# Patient Record
Sex: Female | Born: 1955
Health system: Southern US, Community
[De-identification: ages and names within clinical notes are randomized; demographics above are authoritative.]

## PROBLEM LIST (undated history)

## (undated) DIAGNOSIS — Z780 Asymptomatic menopausal state: Secondary | ICD-10-CM

## (undated) DIAGNOSIS — G4700 Insomnia, unspecified: Secondary | ICD-10-CM

## (undated) DIAGNOSIS — I639 Cerebral infarction, unspecified: Secondary | ICD-10-CM

## (undated) DIAGNOSIS — J309 Allergic rhinitis, unspecified: Secondary | ICD-10-CM

## (undated) DIAGNOSIS — D225 Melanocytic nevi of trunk: Secondary | ICD-10-CM

## (undated) DIAGNOSIS — D229 Melanocytic nevi, unspecified: Secondary | ICD-10-CM

## (undated) HISTORY — DX: Asymptomatic menopausal state: Z78.0

## (undated) HISTORY — DX: Melanocytic nevi, unspecified: D22.9

## (undated) HISTORY — DX: Cerebral infarction, unspecified: I63.9

## (undated) HISTORY — DX: Allergic rhinitis, unspecified: J30.9

## (undated) HISTORY — DX: Melanocytic nevi of trunk: D22.5

## (undated) HISTORY — DX: Insomnia, unspecified: G47.00

---

## 1984-09-10 HISTORY — PX: APPENDECTOMY: SHX54

## 1984-09-10 HISTORY — PX: GALLBLADDER SURGERY: SHX652

## 1999-09-11 HISTORY — PX: PLACEMENT OF BREAST IMPLANTS: SHX6334

## 1999-11-27 ENCOUNTER — Other Ambulatory Visit: Admission: RE | Admit: 1999-11-27 | Discharge: 1999-11-27 | Payer: Self-pay | Admitting: Obstetrics and Gynecology

## 2001-01-10 ENCOUNTER — Other Ambulatory Visit: Admission: RE | Admit: 2001-01-10 | Discharge: 2001-01-10 | Payer: Self-pay | Admitting: Obstetrics and Gynecology

## 2002-01-26 ENCOUNTER — Other Ambulatory Visit: Admission: RE | Admit: 2002-01-26 | Discharge: 2002-01-26 | Payer: Self-pay | Admitting: Obstetrics and Gynecology

## 2003-02-12 ENCOUNTER — Other Ambulatory Visit: Admission: RE | Admit: 2003-02-12 | Discharge: 2003-02-12 | Payer: Self-pay | Admitting: Obstetrics and Gynecology

## 2004-02-16 ENCOUNTER — Other Ambulatory Visit: Admission: RE | Admit: 2004-02-16 | Discharge: 2004-02-16 | Payer: Self-pay | Admitting: Obstetrics and Gynecology

## 2005-03-29 ENCOUNTER — Other Ambulatory Visit: Admission: RE | Admit: 2005-03-29 | Discharge: 2005-03-29 | Payer: Self-pay | Admitting: Obstetrics and Gynecology

## 2005-11-30 ENCOUNTER — Inpatient Hospital Stay (HOSPITAL_COMMUNITY): Admission: AD | Admit: 2005-11-30 | Discharge: 2005-12-04 | Payer: Self-pay | Admitting: *Deleted

## 2006-02-05 ENCOUNTER — Encounter: Payer: Self-pay | Admitting: Interventional Radiology

## 2006-02-18 ENCOUNTER — Ambulatory Visit (HOSPITAL_COMMUNITY): Admission: RE | Admit: 2006-02-18 | Discharge: 2006-02-18 | Payer: Self-pay | Admitting: Interventional Radiology

## 2006-03-08 ENCOUNTER — Emergency Department (HOSPITAL_COMMUNITY): Admission: EM | Admit: 2006-03-08 | Discharge: 2006-03-09 | Payer: Self-pay | Admitting: Emergency Medicine

## 2006-05-27 ENCOUNTER — Ambulatory Visit: Payer: Self-pay | Admitting: Oncology

## 2006-07-23 ENCOUNTER — Encounter (HOSPITAL_COMMUNITY): Admission: RE | Admit: 2006-07-23 | Discharge: 2006-08-12 | Payer: Self-pay | Admitting: *Deleted

## 2008-08-11 ENCOUNTER — Ambulatory Visit (HOSPITAL_COMMUNITY): Admission: RE | Admit: 2008-08-11 | Discharge: 2008-08-11 | Payer: Self-pay | Admitting: Family Medicine

## 2008-08-11 ENCOUNTER — Encounter (INDEPENDENT_AMBULATORY_CARE_PROVIDER_SITE_OTHER): Payer: Self-pay | Admitting: Family Medicine

## 2008-08-11 ENCOUNTER — Ambulatory Visit: Payer: Self-pay | Admitting: Surgery

## 2010-09-30 ENCOUNTER — Encounter: Payer: Self-pay | Admitting: *Deleted

## 2011-01-26 NOTE — Discharge Summary (Signed)
NAMEDAREEN, Jennifer Ochoa            ACCOUNT NO.:  [id number]   MEDICAL RECORD NO.:  [id number]          PATIENT TYPE:  INP   LOCATION:  3019                         FACILITY:  [facility]   PHYSICIAN:  Pramod P. Pearlean Brownie, MD    DATE OF BIRTH:  09-22-1955   DATE OF ADMISSION:  11/30/2005  DATE OF DISCHARGE:  12/04/2005                                 DISCHARGE SUMMARY   DISCHARGE DIAGNOSES AT TIME OF DISCHARGE:  1.  Right internal carotid artery dissection while lifting weights.  2.  Migraines.   MEDICINES AT TIME OF DISCHARGE:  1.  Coumadin 3 mg daily.  2.  Antibiotics for rosacea.   STUDIES PERFORMED:  1.  MRI of the brain showed no acute infarct.  2.  MRA of the brain shows right carotid dissection.  3.  Carotid Doppler shows no ICA stenosis.  4.  Transcranial Doppler has been completed, results are pending.  5.  EKG shows normal sinus rhythm with left axis deviation.   LABORATORY DATA:  INR the day of discharge is 2.1.  CBC normal.  Chemistry  normal except alkaline phosphatase low at 33.  Hemoglobin A1c 5.3.  Homocysteine 10.5.  Cholesterol 129, HDL 59, LDL 61 and triglycerides 44.   HISTORY OF PRESENT ILLNESS:  Jennifer Ochoa is a 55 year old right-handed  white female who was seen by Dr. Nash Shearer, a neurologist, in the outpatient  clinic at the request of Dr. Haroldine Laws for evaluation in his initial  consultation for headaches and questionable right carotid dissection on MRA.  The patient stated about one week ago when she was lifting weights, she  developed central vision loss with severe pain behind her right eye, which  gradually improved.  The patient states that then every time she stood up  quickly or did a quick movement, she developed similar symptoms that lasted  about one minute in duration.  Her symptoms have again gradually improved.  She also states she hears a whishing in her ear ever since the onset of the  headache pain.  Triggers for this pain include standing up,  straining,  moving quickly or exerting herself.  She also states she has felt  lightheaded and dizzy at times.  Dr. Haroldine Laws performed an outpatient  MRI/MRA, which did reveal an acute right carotid dissection though no acute  stroke.  Dr. Nash Shearer admitted her to [facility] for treatment and  evaluation by the stroke service.   HOSPITAL COURSE:  The patient was started on IV heparin and Coumadin for  right ICA dissection.  She neurologically returned to normal with no further  symptoms in the hospital.  The patient was evaluated by PT and OT and felt  to have no needs.  She had no new vascular risk factors identified.  We are  suspicious for possible fibromuscular dysplasia given the clinical onset.  Plans are to keep her on Coumadin for several months, follow up with Dr.  Pearlean Brownie, do a cerebral angiogram at that time for possible FMD and then treat  as per his recommendation.  Will have her primary care physician follow up  Coumadin  levels.  The patient needs to follow a Coumadin-friendly diet.   CONDITION ON DISCHARGE:  Neurologically normal.   DISCHARGE PLAN:  1.  Discharge home in care of husband.  2.  Coumadin for secondary stroke prevention.  3.  Coumadin-associated diet.  The patient has met with a nutritionist but      may need further follow-up, as she has a very green diet.  Dr.   Bethann Humble ended at this point.      Jennifer Ochoa, N.P.    ______________________________  Jennifer Schlein. Pearlean Brownie, MD    SB/MEDQ  D:  12/04/2005  T:  12/05/2005  Job:  [id number]   cc:   Royetta Crochet, MD  Fax: [phone]

## 2011-01-26 NOTE — H&P (Signed)
[name], Ochoa            ACCOUNT NO.:  [id number]   MEDICAL RECORD NO.:  [id number]          PATIENT TYPE:  INP   LOCATION:  3019                         FACILITY:  [facility]   PHYSICIAN:  Bevelyn Buckles. Champey, M.D.DATE OF BIRTH:  12/03/1955   DATE OF ADMISSION:  11/30/2005  DATE OF DISCHARGE:                                HISTORY & PHYSICAL   REASON FOR ADMISSION:  Right carotid dissection and headaches.   HISTORY OF PRESENT ILLNESS:  Jennifer Ochoa is a 55 year old, right handed,  Caucasian female who is seen in outpatient clinic today at the request of  Dr. Haroldine Laws for evaluation/initial consultation of headaches and  questionable right carotid dissection on MRA.  The patient presents to the  clinic today stating one week ago, when she was lifting weights she  developed this central vision loss and severe pain behind her right eye and  gradually improved.  The patient states since then every time she stood up  quickly or did quick movement she developed similar symptoms that lasted  about one minute in duration.  Her symptoms have gradually improved.  She  also feels like she has a hearing/whooshing in her ear ever since the onset.  The pain behind the right eye is described as a pressure 4 in intensity  without any radiation.  Triggers to this include standing up, straining,  moving quickly or exerting herself.  The patient also feels slightly  lightheaded and dizzy at times.  The patient states she developed this a  couple of times a day, however, for the past few days has not felt the  sensation.  She does have a history of migraines and has had similar  episodes, however, the onset was gradual and it usually resolved.  These  symptoms were of acute onset and did not resolve.  The patient had some  palpitations as well during this time frame.  She denies any weakness,  numbness, speech changes, swallowing problems, chewing problems, vertigo,  balance problems, falls, or  loss of consciousness.   PAST MEDICAL HISTORY:  Infrequent migraines.   MEDICATIONS:  Decongestant.   FAMILY HISTORY:  Positive for stroke, heart disease, and migraines.   SOCIAL HISTORY:  The patient is married, currently lives with her husband  and daughter, drinks two caffeinated beverages per day.  Drinks two glasses  of wine weekly.  Denies any tobacco or drug use.  The patient works as a  Chiropractor.   REVIEW OF SYSTEMS:  Positive for headaches, loss of vision, dizziness,  decreased energy, fatigue, rapid heart beat.  Review of systems is negative  as per history of present illness and greater than seven other organ  systems.   PHYSICAL EXAMINATION:  VITAL SIGNS:  Blood pressure is 104/64, weight is 145  pounds, height is 5 feet 7 inches.  GENERAL:  This is a 55 year old Caucasian female in no acute distress,  comfortable, cooperative throughout the entire examination.  HEENT:  Normocephalic atraumatic.  Extraocular muscles are intact.  Pupils  are equal, round, and reactive to light.  NECK:  Supple.  No carotid bruits.  HEART:  Regular.  LUNGS:  Clear.  ABDOMEN:  Soft and nontender.  EXTREMITIES:  Show no edema with good pulses.  NEUROLOGIC:  The patient is awake, alert, and oriented x3.  Language is  fluent.  Memory and knowledge appear within normal limits.  Cranial nerves  II-XII appear grossly intact.  Motor examination shows 5/5 strength and  normal tone in all four extremities.  No drift is noted.  Sensory  examination is within normal limits to light touch.  Reflexes are 1 to 2+  throughout and symmetric.  Cerebellar function is within normal limits  finger-to-nose.  Gait is unremarkable.  The patient does not have a Romberg  sign present.   PROCEDURE:  The patient did have an MRI MRA of the brain which was reviewed.  Her MRI did not show any acute ischemia.  MRA showed very probable  dissection in the right cavernous portion of the carotid with possibly  part  of the right MCA occlusion and possibly small clot around that area.  These  films were reviewed with Dr. Pearlean Brownie, who agreed with the dissection in the  right carotid artery.   IMPRESSION:  Jennifer Ochoa is a 55 year old right handed Caucasian female  who is sent by Dr. Haroldine Laws today and seen outpatient setting for severe  headaches, visual disturbance, and by MRA presumed right carotid dissection.  I have reviewed the films and discussed them with Dr. Pearlean Brownie, our stroke  specialist, who agrees with the right carotid dissection.   1.  I will admit the patient to [facility] and started the patient      on IV heparin stroke protocol and transfer her to Coumadin with a goal      INR of 2 to 3.  2.  I have discussed with Dr. Pearlean Brownie the case, and the patient will need a      cerebral angiogram in three months' time to check the healing of the      dissection.  3.  We will admit her till her INR is therapeutic and follow up with her as      an outpatient with cerebral angiogram in a few months' time.  The      patient understands and agrees with the plan.      Bevelyn Buckles. Nash Shearer, M.D.  Electronically Signed     DRC/MEDQ  D:  11/30/2005  T:  12/01/2005  Job:  [id number]

## 2012-07-29 DIAGNOSIS — D225 Melanocytic nevi of trunk: Secondary | ICD-10-CM

## 2012-07-29 HISTORY — DX: Melanocytic nevi of trunk: D22.5

## 2013-03-31 DIAGNOSIS — D229 Melanocytic nevi, unspecified: Secondary | ICD-10-CM

## 2013-03-31 HISTORY — DX: Melanocytic nevi, unspecified: D22.9

## 2013-07-28 ENCOUNTER — Other Ambulatory Visit: Payer: Self-pay | Admitting: Obstetrics and Gynecology

## 2013-07-28 DIAGNOSIS — R928 Other abnormal and inconclusive findings on diagnostic imaging of breast: Secondary | ICD-10-CM

## 2013-08-12 ENCOUNTER — Institutional Professional Consult (permissible substitution): Payer: Self-pay | Admitting: Interventional Cardiology

## 2013-08-13 ENCOUNTER — Encounter (INDEPENDENT_AMBULATORY_CARE_PROVIDER_SITE_OTHER): Payer: Self-pay

## 2013-08-13 ENCOUNTER — Encounter: Payer: Self-pay | Admitting: Interventional Cardiology

## 2013-08-13 ENCOUNTER — Ambulatory Visit (INDEPENDENT_AMBULATORY_CARE_PROVIDER_SITE_OTHER): Payer: Managed Care, Other (non HMO) | Admitting: Interventional Cardiology

## 2013-08-13 ENCOUNTER — Ambulatory Visit
Admission: RE | Admit: 2013-08-13 | Discharge: 2013-08-13 | Disposition: A | Discharge status: Home / Self Care | Payer: Managed Care, Other (non HMO) | Source: Ambulatory Visit | Attending: Obstetrics and Gynecology | Admitting: Obstetrics and Gynecology

## 2013-08-13 ENCOUNTER — Encounter: Payer: Self-pay | Admitting: Cardiology

## 2013-08-13 VITALS — BP 110/70 | HR 64 | Ht 67.0 in | Wt 168.8 lb

## 2013-08-13 DIAGNOSIS — I7771 Dissection of carotid artery: Secondary | ICD-10-CM

## 2013-08-13 DIAGNOSIS — I2510 Atherosclerotic heart disease of native coronary artery without angina pectoris: Secondary | ICD-10-CM | POA: Insufficient documentation

## 2013-08-13 DIAGNOSIS — M542 Cervicalgia: Secondary | ICD-10-CM | POA: Insufficient documentation

## 2013-08-13 DIAGNOSIS — G459 Transient cerebral ischemic attack, unspecified: Secondary | ICD-10-CM | POA: Insufficient documentation

## 2013-08-13 DIAGNOSIS — R928 Other abnormal and inconclusive findings on diagnostic imaging of breast: Secondary | ICD-10-CM

## 2013-08-13 DIAGNOSIS — R079 Chest pain, unspecified: Secondary | ICD-10-CM

## 2013-08-13 NOTE — Patient Instructions (Signed)
Your physician has requested that you have an exercise tolerance test. For further information please visit [email or web address] Please also follow instruction sheet, as given.  Your physician has requested that you have a carotid duplex. This test is an ultrasound of the carotid arteries in your neck. It looks at blood flow through these arteries that supply the brain with blood. Allow one hour for this exam. There are no restrictions or special instructions.

## 2013-08-13 NOTE — Progress Notes (Signed)
Patient ID: Jennifer Ochoa, female   DOB: [birth year], 57 y.o.   MRN: [id number]     Patient ID: Jennifer Ochoa MRN: [id number] DOB/AGE: [birth year] 57 y.o.   Referring Physician Dr. Wynelle Link   Reason for Consultation chest pain  HPI: 57 y/o with a h/o carotid dissection.  She has been having chest pain at times.  It has been better recently since starting antiinflamatory meds.  It is worse with lying flat and with deep breathing.  She is very physically active. She walks several miles a day.  No problems when walking or doing martial arts.  No sequelae now from the carotid dissection.    She has not had any sweating, nausea, syncope, orthopnea, PND. Taking deep breaths causes pain when she has these episodes. They only occur while she is lying flat. Once she gets up, stretches and starts moving around, she feels quite well.   Current Outpatient Prescriptions  Medication Sig Dispense Refill  . ALPRAZolam (XANAX) 0.25 MG tablet Take 0.25 mg by mouth at bedtime as needed (half tablet as needed).       Marland Kitchen aspirin 81 MG tablet Take 81 mg by mouth daily.      Marland Kitchen BLACK COHOSH PO Take by mouth.      . meloxicam (MOBIC) 15 MG tablet Take 15 mg by mouth daily.       . minocycline (DYNACIN) 100 MG tablet Take 100 mg by mouth 2 (two) times daily.      Marland Kitchen zolpidem (AMBIEN) 10 MG tablet Take 10 mg by mouth at bedtime as needed (half tablet as needed).        No current facility-administered medications for this visit.   Past Medical History  Diagnosis Date  . Allergic rhinitis   . Insomnia   . Menopause   . Stroke     Family History  Problem Relation Age of Onset  . Heart attack Mother   . CVA Mother   . Lung cancer Father   . Prostate cancer Father     History   Social History  . Marital Status: Married    Spouse Name: N/A    Number of Children: N/A  . Years of Education: N/A   Occupational History  . Not on file.   Social History Main Topics  . Smoking status: Never Smoker   .  Smokeless tobacco: Not on file  . Alcohol Use: Not on file  . Drug Use: Not on file  . Sexual Activity: Not on file   Other Topics Concern  . Not on file   Social History Narrative  . No narrative on file    Past Surgical History  Procedure Laterality Date  . Gallbladder surgery  1986  . Appendectomy  1986  . Placement of breast implants  2001      (Not in a hospital admission)  Review of systems complete and found to be negative unless listed above .  No nausea, vomiting.  No fever chills, No focal weakness,  No palpitations.  Physical Exam: Filed Vitals:   08/13/13 1031  BP: 110/70  Pulse: 64    Weight: 168 lb 12.8 oz (76.567 kg)  Physical exam:  [place]/AT EOMI No JVD, No carotid bruit RRR S1S2  No wheezing Soft. NT, nondistended No edema. No focal motor or sensory deficits Normal affect  Labs:   No results found for this basename: WBC, HGB, HCT, MCV, PLT   No results found for this basename: NA, K, CL, CO2,  BUN, CREATININE, CALCIUM, LABALBU, PROT, BILITOT, ALKPHOS, ALT, AST, GLUCOSE,  in the last 168 hours No results found for this basename: CKTOTAL, CKMB, CKMBINDEX, TROPONINI    No results found for this basename: CHOL   No results found for this basename: HDL   No results found for this basename: LDLCALC   No results found for this basename: TRIG   No results found for this basename: CHOLHDL   No results found for this basename: LDLDIRECT      Radiology: 2011 carotid duplex showed no significant plaque bilaterally, despite history of carotid dissection EKG: normal sinus rhythm, nonspecific ST-T wave changes  ASSESSMENT AND PLAN:   Chest pain:: several atypical features.I doubt this is ischemic pain. Will plan for exercise treadmill test just to get her an exercise prescription. Her lipids have been well controlled. Her LDL was in the 70s at last check a few weeks ago.  Carotid artery dissection: She states that she had some small strokes at the  immediate time of a carotid dissection, but does neurologic deficits have resolved. I would recommend avoiding any heavy weight lifting or straining given her history of dissection. Signed:   Fredric Mare, MD, [facility] 08/13/2013, 10:49 AM

## 2013-08-28 ENCOUNTER — Telehealth: Payer: Self-pay | Admitting: Interventional Cardiology

## 2013-08-28 NOTE — Telephone Encounter (Signed)
New problem       Pt called and would like to know if she can wear a monitor since the pressure, crushing feeling on the lift side of her chest, happens at night when she is laying down.  Before she has the Stress tests.   Please leave a message on cell if you do not get her.

## 2013-09-23 ENCOUNTER — Ambulatory Visit (INDEPENDENT_AMBULATORY_CARE_PROVIDER_SITE_OTHER): Payer: Managed Care, Other (non HMO) | Admitting: Physician Assistant

## 2013-09-23 ENCOUNTER — Ambulatory Visit (HOSPITAL_COMMUNITY): Payer: Managed Care, Other (non HMO) | Attending: Cardiology

## 2013-09-23 ENCOUNTER — Encounter: Payer: Self-pay | Admitting: Cardiology

## 2013-09-23 DIAGNOSIS — R079 Chest pain, unspecified: Secondary | ICD-10-CM

## 2013-09-23 DIAGNOSIS — I7771 Dissection of carotid artery: Secondary | ICD-10-CM

## 2013-09-23 DIAGNOSIS — I2510 Atherosclerotic heart disease of native coronary artery without angina pectoris: Secondary | ICD-10-CM | POA: Insufficient documentation

## 2013-09-23 DIAGNOSIS — Z09 Encounter for follow-up examination after completed treatment for conditions other than malignant neoplasm: Secondary | ICD-10-CM | POA: Insufficient documentation

## 2013-09-23 DIAGNOSIS — Z8673 Personal history of transient ischemic attack (TIA), and cerebral infarction without residual deficits: Secondary | ICD-10-CM | POA: Insufficient documentation

## 2013-09-23 NOTE — Progress Notes (Signed)
Exercise Treadmill Test  Pre-Exercise Testing Evaluation Rhythm: sinus bradycardia  Rate: 57 bpm     Test  Exercise Tolerance Test Ordering MD: Casandra Doffing, MD  Interpreting MD: Truitt Merle, NP  Unique Test No: 1  Treadmill:  1  Indication for ETT: chest pain - rule out ischemia  Contraindication to ETT: No   Stress Modality: exercise - treadmill  Cardiac Imaging Performed: non   Protocol: standard Bruce - maximal  Max BP:  196/83  Max MPHR (bpm):  162 85% MPR (bpm):  138  MPHR obtained (bpm):  160 % MPHR obtained:  98  Reached 85% MPHR (min:sec):  8:00 Total Exercise Time (min-sec):  10:00  Workload in METS:  11.7 Borg Scale: 17  Reason ETT Terminated:  patient's desire to stop    ST Segment Analysis At Rest: normal ST segments - no evidence of significant ST depression With Exercise: non-specific ST changes  Other Information Arrhythmia:  No Angina during ETT:  absent (0) Quality of ETT:  diagnostic  ETT Interpretation:  normal - no evidence of ischemia by ST analysis  Comments: Excellent exercise capacity. No chest pain. Normal BP response to exercise. There was increased artifact that interferes with interpretation somewhat. There was insignificant up-sloping ST depression at peak exercise.  No significant ST changes to suggest ischemia.   Recommendations: F/u with Dr. Casandra Doffing as directed. Signed,  Richardson Dopp, PA-C   09/23/2013 11:36 AM

## 2013-09-24 ENCOUNTER — Encounter (HOSPITAL_COMMUNITY): Payer: Self-pay | Admitting: Interventional Cardiology

## 2014-08-23 ENCOUNTER — Ambulatory Visit (INDEPENDENT_AMBULATORY_CARE_PROVIDER_SITE_OTHER): Payer: Managed Care, Other (non HMO) | Admitting: Podiatry

## 2014-08-23 ENCOUNTER — Ambulatory Visit (INDEPENDENT_AMBULATORY_CARE_PROVIDER_SITE_OTHER): Payer: Managed Care, Other (non HMO)

## 2014-08-23 ENCOUNTER — Encounter: Payer: Self-pay | Admitting: Podiatry

## 2014-08-23 VITALS — BP 124/70 | HR 70 | Resp 18

## 2014-08-23 DIAGNOSIS — R52 Pain, unspecified: Secondary | ICD-10-CM

## 2014-08-23 DIAGNOSIS — M2042 Other hammer toe(s) (acquired), left foot: Secondary | ICD-10-CM

## 2014-08-23 DIAGNOSIS — M2012 Hallux valgus (acquired), left foot: Secondary | ICD-10-CM

## 2014-08-23 DIAGNOSIS — M21612 Bunion of left foot: Secondary | ICD-10-CM

## 2014-08-23 NOTE — Progress Notes (Signed)
° °  Subjective:    Patient ID: Jennifer Ochoa, female    DOB: 1956/03/13, 58 y.o.   MRN: [id number]  HPI MY 2ND TOE ON THE LEFT IS GRADUALLY GETTING WORSE AND HAS BEEN HURTING FOR ABOUT 2 MONTHS AND HAS PAIN ON THE BALL OF MY LEFT FOOT AND HURTS ALL THE TIME    Review of Systems  All other systems reviewed and are negative.      Objective:   Physical Exam        Assessment & Plan:

## 2014-08-25 NOTE — Progress Notes (Signed)
Subjective:     Patient ID: Jennifer Ochoa, female   DOB: 1955/09/12, 58 y.o.   MRN: [id number]  HPI patient states that she's getting more problems with her left foot and the second toe has moved quite a bit and a upwards direction towards her big toe and it's becoming painful in the joint and also her structural bunion seems to be worsening. We had corrected her right bunion years ago   Review of Systems  All other systems reviewed and are negative.      Objective:   Physical Exam  Constitutional: She is oriented to person, place, and time.  Cardiovascular: Intact distal pulses.   Musculoskeletal: Normal range of motion.  Neurological: She is oriented to person, place, and time.  Skin: Skin is warm.  Nursing note and vitals reviewed.  neurovascular status intact with muscle strength adequate range of motion within normal limits and negative Homans sign noted. Patient is noted to have good digital perfusion is well oriented 3 with no equinus condition noted and is found to have structural bunion deformity left with the left hallux deviated against the second toe and elevation and medial rotation of the second toe left with inflammation of the second metatarsophalangeal joint upon palpation     Assessment:     Structural imbalance of the left foot with probable flexor plate stretch or tear of the second MPJ and gradual worsening of structural bony conditions with inflammatory capsulitis    Plan:     H&P and x-rays reviewed with patient. At this point I have recommended due to the extreme movement of the second toe pain that she is experiencing the consideration for structural correction versus injection treatment which would be temporary. Patient has opted for correction of foot and since she had the other one done she would like to read a consent form now so she does not have another co-pay. At this time I allowed her to read consent form for correction reviewing [place] bunionectomy  left with pin fixation digital fusion left and shortening transpositional osteotomy second metatarsal left. Explain the second toe would be stiff afterwards and may lift in the air and that all possible complications as listed in the consent form is possible. She understands this and after extensive review signs consent form understanding total recovery. Can take upwards of 6 months scheduled for outpatient surgery and is given all preoperative instructions

## 2014-09-07 ENCOUNTER — Encounter: Payer: Self-pay | Admitting: Podiatry

## 2014-09-07 DIAGNOSIS — M21542 Acquired clubfoot, left foot: Secondary | ICD-10-CM

## 2014-09-07 DIAGNOSIS — M2012 Hallux valgus (acquired), left foot: Secondary | ICD-10-CM

## 2014-09-07 DIAGNOSIS — M2042 Other hammer toe(s) (acquired), left foot: Secondary | ICD-10-CM

## 2014-09-08 ENCOUNTER — Other Ambulatory Visit: Payer: Self-pay | Admitting: Podiatry

## 2014-09-08 MED ORDER — ONDANSETRON 8 MG PO TBDP: 8.0000 mg | ORAL_TABLET | Freq: Three times a day (TID) | ORAL | Status: DC | PRN

## 2014-09-13 ENCOUNTER — Telehealth: Payer: Self-pay | Admitting: *Deleted

## 2014-09-13 NOTE — Telephone Encounter (Signed)
Pt left only name and phone number.

## 2014-09-14 ENCOUNTER — Encounter: Payer: Self-pay | Admitting: Podiatry

## 2014-09-14 ENCOUNTER — Ambulatory Visit (INDEPENDENT_AMBULATORY_CARE_PROVIDER_SITE_OTHER): Payer: Managed Care, Other (non HMO) | Admitting: Podiatry

## 2014-09-14 ENCOUNTER — Ambulatory Visit (INDEPENDENT_AMBULATORY_CARE_PROVIDER_SITE_OTHER): Payer: Managed Care, Other (non HMO)

## 2014-09-14 VITALS — BP 113/67 | HR 79 | Resp 16

## 2014-09-14 DIAGNOSIS — M2012 Hallux valgus (acquired), left foot: Secondary | ICD-10-CM

## 2014-09-14 DIAGNOSIS — M21612 Bunion of left foot: Secondary | ICD-10-CM

## 2014-09-14 DIAGNOSIS — M2042 Other hammer toe(s) (acquired), left foot: Secondary | ICD-10-CM

## 2014-09-14 NOTE — Progress Notes (Signed)
Subjective:     Patient ID: Jennifer Ochoa, female   DOB: 05-Jun-1956, 59 y.o.   MRN: [id number]  HPI patient states I'm doing really well with my left with minimal discomfort or swelling   Review of Systems     Objective:   Physical Exam Neurovascular status intact muscle strength adequate with wound edges found to be well coapted and pin in place second toe left with good structural bunion correction noted    Assessment:     Doing well post forefoot reconstruction left    Plan:     H&P and x-rays reviewed. Reapplied sterile dressing instructed on continued elevation compression and immobilization and reappoint to recheck again in 4 weeks for consideration of pin removal. Patient will be seen back earlier if any issues should occur

## 2014-09-15 NOTE — Progress Notes (Signed)
Dr Paulla Dolly performed a left [facility] bunion repair, left 2nd met osteotomy and left 2nd met hammertoe repair on 12.29.15

## 2014-10-12 ENCOUNTER — Other Ambulatory Visit: Payer: Self-pay | Admitting: Podiatry

## 2014-10-12 ENCOUNTER — Ambulatory Visit (INDEPENDENT_AMBULATORY_CARE_PROVIDER_SITE_OTHER): Payer: Managed Care, Other (non HMO) | Admitting: Podiatry

## 2014-10-12 ENCOUNTER — Ambulatory Visit (INDEPENDENT_AMBULATORY_CARE_PROVIDER_SITE_OTHER): Payer: Managed Care, Other (non HMO)

## 2014-10-12 ENCOUNTER — Ambulatory Visit: Payer: Managed Care, Other (non HMO)

## 2014-10-12 ENCOUNTER — Encounter: Payer: Self-pay | Admitting: Podiatry

## 2014-10-12 VITALS — BP 118/67 | HR 74 | Resp 16

## 2014-10-12 DIAGNOSIS — M2012 Hallux valgus (acquired), left foot: Secondary | ICD-10-CM

## 2014-10-12 DIAGNOSIS — M2042 Other hammer toe(s) (acquired), left foot: Secondary | ICD-10-CM

## 2014-10-12 DIAGNOSIS — M21612 Bunion of left foot: Secondary | ICD-10-CM

## 2014-10-12 NOTE — Progress Notes (Signed)
Subjective:     Patient ID: Jennifer Ochoa, female   DOB: 1956/03/01, 59 y.o.   MRN: [id number]  HPI patient states I'm doing very well with my left foot and I'm able to walk without pain   Review of Systems     Objective:   Physical Exam Neurovascular status intact pin in place second toe left with wound edges well coapted and good alignment noted    Assessment:     Doing well post forefoot reconstruction left    Plan:     Pin removed second toe with dressing applied and x-ray taken showing excellent results. Begin gradual increase in activity levels

## 2014-11-04 ENCOUNTER — Telehealth: Payer: Self-pay | Admitting: *Deleted

## 2014-11-04 ENCOUNTER — Ambulatory Visit (INDEPENDENT_AMBULATORY_CARE_PROVIDER_SITE_OTHER): Payer: Managed Care, Other (non HMO) | Admitting: Podiatry

## 2014-11-04 ENCOUNTER — Encounter: Payer: Self-pay | Admitting: Podiatry

## 2014-11-04 VITALS — BP 116/76 | HR 76 | Resp 12

## 2014-11-04 DIAGNOSIS — M2012 Hallux valgus (acquired), left foot: Secondary | ICD-10-CM

## 2014-11-04 DIAGNOSIS — M21612 Bunion of left foot: Secondary | ICD-10-CM

## 2014-11-04 DIAGNOSIS — R52 Pain, unspecified: Secondary | ICD-10-CM

## 2014-11-04 NOTE — Telephone Encounter (Signed)
DOS 12/292015  Pt states she is having pain behind the 2nd toe of her surgery foot with a little swelling and BB feeliing on the bottom.  I encouraged pt to use ice for the swelling and discomfort, and transferred to the schedulers for earlier appt than 03/0/03/2015.

## 2014-11-05 NOTE — Progress Notes (Signed)
Subjective:     Patient ID: Jennifer Ochoa, female   DOB: 05/03/1956, 59 y.o.   MRN: [id number]  HPI patient states she feels like there is a little balls in the bottom of her second toe left foot   Review of Systems     Objective:   Physical Exam Neurovascular status intact with irritation underneath the second toe left that's nondescript in nature with no specific injury noted    Assessment:     May be excessive edema during healing process    Plan:     Advised on soaks and we may need to do a small injection if symptoms persist

## 2014-11-15 ENCOUNTER — Ambulatory Visit (INDEPENDENT_AMBULATORY_CARE_PROVIDER_SITE_OTHER): Payer: Managed Care, Other (non HMO) | Admitting: Podiatry

## 2014-11-15 ENCOUNTER — Encounter: Payer: Self-pay | Admitting: Podiatry

## 2014-11-15 ENCOUNTER — Ambulatory Visit: Payer: Self-pay

## 2014-11-15 VITALS — BP 109/77 | HR 68 | Resp 12

## 2014-11-15 DIAGNOSIS — Z9889 Other specified postprocedural states: Secondary | ICD-10-CM

## 2014-11-16 NOTE — Progress Notes (Signed)
Subjective:     Patient ID: Jennifer Ochoa, female   DOB: 05-Jun-1956, 59 y.o.   MRN: [id number]  HPI patient states my left foot is feeling quite a bit better the second toe is doing better   Review of Systems     Objective:   Physical Exam Neurovascular status intact with muscle strength adequate and patient's noted to have some inflammation in the plantar aspect left second toe that is sore when pressed much improved over previous    Assessment:     Doing better after extensive forefoot reconstruction left    Plan:     Advised on continued soaks and he treatments the bottom of the toe and continued range of motion exercises. Return to shoe gear and she is given return to work on Monday as she is making continued good improvement

## 2017-01-07 ENCOUNTER — Other Ambulatory Visit: Payer: Self-pay | Admitting: Endocrinology

## 2017-01-07 DIAGNOSIS — E010 Iodine-deficiency related diffuse (endemic) goiter: Secondary | ICD-10-CM

## 2017-01-15 ENCOUNTER — Other Ambulatory Visit: Payer: Managed Care, Other (non HMO)

## 2017-01-18 ENCOUNTER — Ambulatory Visit
Admission: RE | Admit: 2017-01-18 | Discharge: 2017-01-18 | Disposition: A | Discharge status: Home / Self Care | Payer: Managed Care, Other (non HMO) | Source: Ambulatory Visit | Attending: Endocrinology | Admitting: Endocrinology

## 2017-01-18 DIAGNOSIS — E010 Iodine-deficiency related diffuse (endemic) goiter: Secondary | ICD-10-CM

## 2017-12-06 ENCOUNTER — Other Ambulatory Visit: Payer: Self-pay | Admitting: Obstetrics and Gynecology

## 2017-12-06 DIAGNOSIS — Z803 Family history of malignant neoplasm of breast: Secondary | ICD-10-CM

## 2018-10-22 ENCOUNTER — Ambulatory Visit: Payer: Managed Care, Other (non HMO) | Admitting: Podiatry

## 2018-10-22 ENCOUNTER — Ambulatory Visit (INDEPENDENT_AMBULATORY_CARE_PROVIDER_SITE_OTHER): Payer: 59

## 2018-10-22 ENCOUNTER — Encounter: Payer: Self-pay | Admitting: Podiatry

## 2018-10-22 DIAGNOSIS — M2042 Other hammer toe(s) (acquired), left foot: Secondary | ICD-10-CM | POA: Diagnosis not present

## 2018-10-22 DIAGNOSIS — G5762 Lesion of plantar nerve, left lower limb: Secondary | ICD-10-CM

## 2018-10-22 DIAGNOSIS — D3610 Benign neoplasm of peripheral nerves and autonomic nervous system, unspecified: Secondary | ICD-10-CM

## 2018-10-26 NOTE — Progress Notes (Signed)
Subjective:   Patient ID: Jennifer Ochoa, female   DOB: 63 y.o.   MRN: [id number]   HPI Patient presents stating I seem to be getting a lot of pain in my second toe and it seems to come and go and I am not sure what causes it.  I did have hammertoe and bunion surgery performed a number of years ago   Review of Systems  All other systems reviewed and are negative.       Objective:  Physical Exam Vitals signs and nursing note reviewed.  Constitutional:      Appearance: She is well-developed.  Pulmonary:     Effort: Pulmonary effort is normal.  Musculoskeletal: Normal range of motion.  Skin:    General: Skin is warm.  Neurological:     Mental Status: She is alert.     Neurovascular status intact muscle strength is adequate range of motion within normal limits with patient noted to have inflammation pain which appears to be mostly in the second intermetatarsal space with positive Mulder sign and radiating discomfort into the adjacent digit.  Patient second digit overall is in good alignment and the interphalangeal joint appears to be healed well     Assessment:  Probability for neuroma symptomatology second interspace left versus other pathology     Plan:  H&P condition reviewed and today I met a focus on the interspace.  I did sterile prep of the area I then injected directly into the interspace with a combination of steroidal medication and Marcaine and we will see results over the next few weeks and decide whether or not to continue treatment is necessary or possible surgical resection.  Reappoint to reevaluate  X-ray indicates fixation is in place good alignment second digit with no elevation or pathology of the MPJ

## 2019-04-10 ENCOUNTER — Other Ambulatory Visit: Payer: Self-pay

## 2019-04-10 DIAGNOSIS — Z20822 Contact with and (suspected) exposure to covid-19: Secondary | ICD-10-CM

## 2019-04-11 LAB — NOVEL CORONAVIRUS, NAA: SARS-CoV-2, NAA: NOT DETECTED

## 2019-05-06 ENCOUNTER — Other Ambulatory Visit: Payer: Self-pay | Admitting: Family Medicine

## 2019-05-06 DIAGNOSIS — Z1231 Encounter for screening mammogram for malignant neoplasm of breast: Secondary | ICD-10-CM

## 2019-06-02 ENCOUNTER — Other Ambulatory Visit: Payer: Self-pay | Admitting: Family Medicine

## 2019-06-02 ENCOUNTER — Other Ambulatory Visit (HOSPITAL_COMMUNITY)
Admission: RE | Admit: 2019-06-02 | Discharge: 2019-06-02 | Disposition: A | Discharge status: Home / Self Care | Payer: 59 | Source: Ambulatory Visit | Attending: Family Medicine | Admitting: Family Medicine

## 2019-06-02 DIAGNOSIS — Z01419 Encounter for gynecological examination (general) (routine) without abnormal findings: Secondary | ICD-10-CM | POA: Diagnosis not present

## 2019-06-10 LAB — CYTOLOGY - PAP
Adequacy: ABSENT
Diagnosis: NEGATIVE

## 2019-06-18 ENCOUNTER — Other Ambulatory Visit: Payer: Self-pay

## 2019-06-18 ENCOUNTER — Ambulatory Visit
Admission: RE | Admit: 2019-06-18 | Discharge: 2019-06-18 | Disposition: A | Discharge status: Home / Self Care | Payer: Managed Care, Other (non HMO) | Source: Ambulatory Visit | Attending: Family Medicine | Admitting: Family Medicine

## 2019-06-18 DIAGNOSIS — Z1231 Encounter for screening mammogram for malignant neoplasm of breast: Secondary | ICD-10-CM

## 2019-12-25 ENCOUNTER — Other Ambulatory Visit: Payer: Self-pay

## 2019-12-25 ENCOUNTER — Ambulatory Visit: Payer: 59 | Admitting: Physician Assistant

## 2019-12-25 ENCOUNTER — Encounter: Payer: Self-pay | Admitting: Physician Assistant

## 2019-12-25 DIAGNOSIS — Z1283 Encounter for screening for malignant neoplasm of skin: Secondary | ICD-10-CM | POA: Diagnosis not present

## 2019-12-25 NOTE — Progress Notes (Signed)
   Follow-Up Visit   Subjective  Jennifer Ochoa is a 64 y.o. female who presents for the following: Annual Exam (no concerns). H/O DN.    The following portions of the chart were reviewed this encounter and updated as appropriate: Tobacco  Allergies  Meds  Problems  Med Hx  Surg Hx  Fam Hx      Objective  Well appearing patient in no apparent distress; mood and affect are within normal limits.  A full examination was performed including scalp, head, eyes, ears, nose, lips, neck, chest, axillae, abdomen, back, buttocks, bilateral upper extremities, bilateral lower extremities, hands, feet, fingers, toes, fingernails, and toenails. All findings within normal limits unless otherwise noted below.  Objective  Head to Toe: No DN, No signs NMSC   Assessment & Plan  Screening exam for skin cancer Head to Toe  Yearly skin examinations

## 2020-03-11 ENCOUNTER — Encounter: Payer: Self-pay | Admitting: Podiatry

## 2020-03-11 ENCOUNTER — Ambulatory Visit (INDEPENDENT_AMBULATORY_CARE_PROVIDER_SITE_OTHER): Payer: 59 | Admitting: Podiatry

## 2020-03-11 ENCOUNTER — Other Ambulatory Visit: Payer: Self-pay

## 2020-03-11 ENCOUNTER — Ambulatory Visit: Payer: 59

## 2020-03-11 DIAGNOSIS — D3610 Benign neoplasm of peripheral nerves and autonomic nervous system, unspecified: Secondary | ICD-10-CM | POA: Diagnosis not present

## 2020-03-11 DIAGNOSIS — Z472 Encounter for removal of internal fixation device: Secondary | ICD-10-CM | POA: Diagnosis not present

## 2020-03-11 DIAGNOSIS — M2042 Other hammer toe(s) (acquired), left foot: Secondary | ICD-10-CM

## 2020-03-16 NOTE — Progress Notes (Signed)
Subjective:   Patient ID: Jennifer Ochoa, female   DOB: 64 y.o.   MRN: [id number]   HPI Patient presents stating I am getting a lot of pain between my second and third digits on my left foot and it improved after the injection but only for a short period of time I tried wider shoes I tried other treatments and I am looking for something more permanent   ROS      Objective:  Physical Exam  Neurovascular status intact with patient found to have shooting pain second interspace left foot with radiating discomfort between the 2 toes positive Mulder sign.  Patient also may have some issues with previous screw that is been inserted second metatarsal left with mild discomfort occurring dorsal and plantar around the screws itself.     Assessment:  Probability for neuroma symptomatology second interspace left foot along with probability for abnormal screw position second metatarsal     Plan:  H NP reviewed condition and discussed treatment options.  She would like a more definitive treatment and I recommended combination of second interspace neurectomy along with screw removal and I explained procedure risk and allowed her to review been then signed consent form going over alternative treatments complications.  Patient understands there is no guarantee as far success and understands everything is outlined and is willing to accept risk and is scheduled for outpatient surgery.  Patient encouraged to call with questions concerns  X-rays were negative for signs of arthritis or stress fracture associated with the pain she is experiencing

## 2020-03-17 ENCOUNTER — Telehealth: Payer: Self-pay

## 2020-03-17 NOTE — Telephone Encounter (Signed)
DOS 03/29/2020  REMOVAL FIXATION DEEP LT - 20680 NEURECTOMY 2ND LT - 28080  [employer] EFFECTIVE DATE - 09/10/2017  PLAN DEDUCTIBLE - $500.00 W/ $500.00 REMAINING OUT OF POCKET - $2000.00 W/ $1950.76 REMAINING COPAY $0.00 COINSURANCE - 80%  [facility] CALL REF # [id number]  SPOKE TO ERIN D AT [facility] STATED CPT 20680 AND 01655 DO NOT REQUIRE AUTH. CALL REF # Junie Panning D 03/17/2020

## 2020-03-28 MED ORDER — HYDROCODONE-ACETAMINOPHEN 10-325 MG PO TABS: 1.0000 | ORAL_TABLET | Freq: Three times a day (TID) | ORAL | 0 refills | Status: AC | PRN

## 2020-03-28 NOTE — Addendum Note (Signed)
Addended by: Wallene Huh on: 03/28/2020 01:31 PM   Modules accepted: Orders

## 2020-03-29 ENCOUNTER — Encounter: Payer: Self-pay | Admitting: Podiatry

## 2020-03-29 DIAGNOSIS — G5761 Lesion of plantar nerve, right lower limb: Secondary | ICD-10-CM | POA: Diagnosis not present

## 2020-04-06 ENCOUNTER — Encounter: Payer: Self-pay | Admitting: Podiatry

## 2020-04-06 ENCOUNTER — Ambulatory Visit (INDEPENDENT_AMBULATORY_CARE_PROVIDER_SITE_OTHER): Payer: 59 | Admitting: Podiatry

## 2020-04-06 ENCOUNTER — Other Ambulatory Visit: Payer: Self-pay

## 2020-04-06 DIAGNOSIS — D3610 Benign neoplasm of peripheral nerves and autonomic nervous system, unspecified: Secondary | ICD-10-CM

## 2020-04-18 MED ORDER — DOXYCYCLINE HYCLATE 100 MG PO TABS: 100.0000 mg | ORAL_TABLET | Freq: Two times a day (BID) | ORAL | 0 refills | Status: AC

## 2020-05-13 ENCOUNTER — Ambulatory Visit (INDEPENDENT_AMBULATORY_CARE_PROVIDER_SITE_OTHER): Payer: Medicare HMO | Admitting: Podiatry

## 2020-05-13 ENCOUNTER — Other Ambulatory Visit: Payer: Self-pay

## 2020-05-13 ENCOUNTER — Encounter: Payer: Self-pay | Admitting: Podiatry

## 2020-05-13 DIAGNOSIS — M779 Enthesopathy, unspecified: Secondary | ICD-10-CM

## 2020-05-17 DIAGNOSIS — Z8679 Personal history of other diseases of the circulatory system: Secondary | ICD-10-CM | POA: Diagnosis not present

## 2020-05-17 DIAGNOSIS — Z136 Encounter for screening for cardiovascular disorders: Secondary | ICD-10-CM | POA: Diagnosis not present

## 2020-05-17 DIAGNOSIS — Z0000 Encounter for general adult medical examination without abnormal findings: Secondary | ICD-10-CM | POA: Diagnosis not present

## 2020-05-17 DIAGNOSIS — Z131 Encounter for screening for diabetes mellitus: Secondary | ICD-10-CM | POA: Diagnosis not present

## 2020-05-17 NOTE — Progress Notes (Signed)
Subjective:   Patient ID: Jennifer Ochoa, female   DOB: 64 y.o.   MRN: [id number]   HPI Patient presents stating she is still getting some pain in her left foot and while it does not feel quite the same she wanted to get it checked neuroma   ROS      Objective:  Physical Exam  Neurovascular status intact with patient noted to have significant diminishment of the sharp pain she had previously but there is some pain that is more around the second metatarsal phalangeal joint     Assessment:  Possibility for low-grade capsulitis secondary increase blood flow into the area with discomfort in the second interspace which has improved     Plan:  H&P reviewed condition.  At this point I did apply padding system to reduce stress on the joint advised on anti-inflammatory stiff bottom shoes and will be seen back for [place] to recheck as needed.  May require cortisone injection or other treatment

## 2020-07-08 IMAGING — MG MM  DIGITAL SCREENING BREAST BILAT IMPLANT W/ TOMO W/ CAD
8 of 12 series · 8 of 28 positions shown · non-contrast
Comparison: Previous exam(s).

CLINICAL DATA: Screening.

EXAM:
DIGITAL SCREENING BILATERAL MAMMOGRAM WITH IMPLANTS, CAD AND TOMO
The patient has retropectoral implants. Standard and implant
displaced views were performed.

[L MLO]
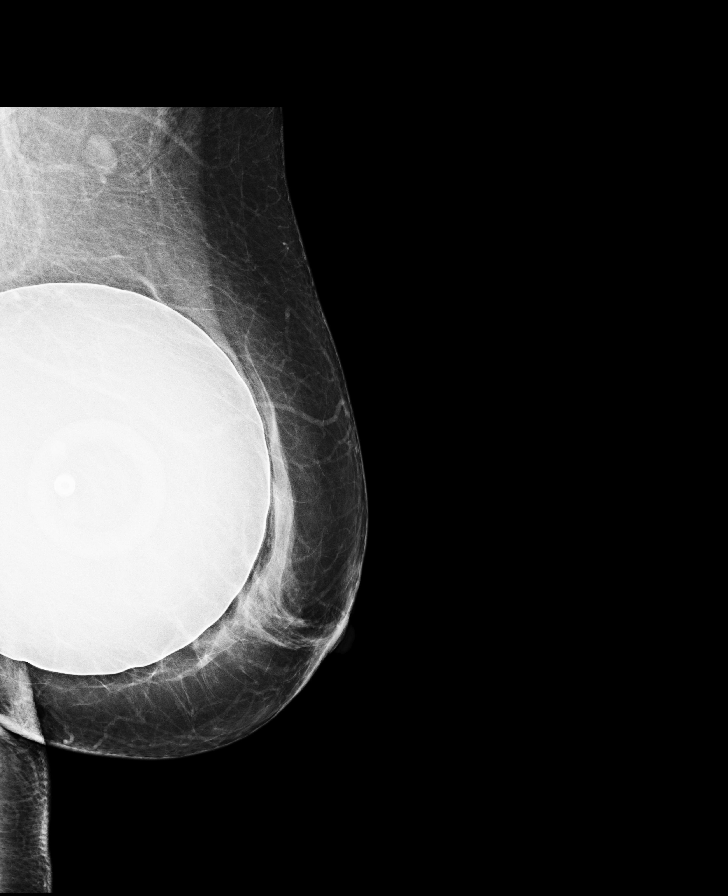

[R CC]
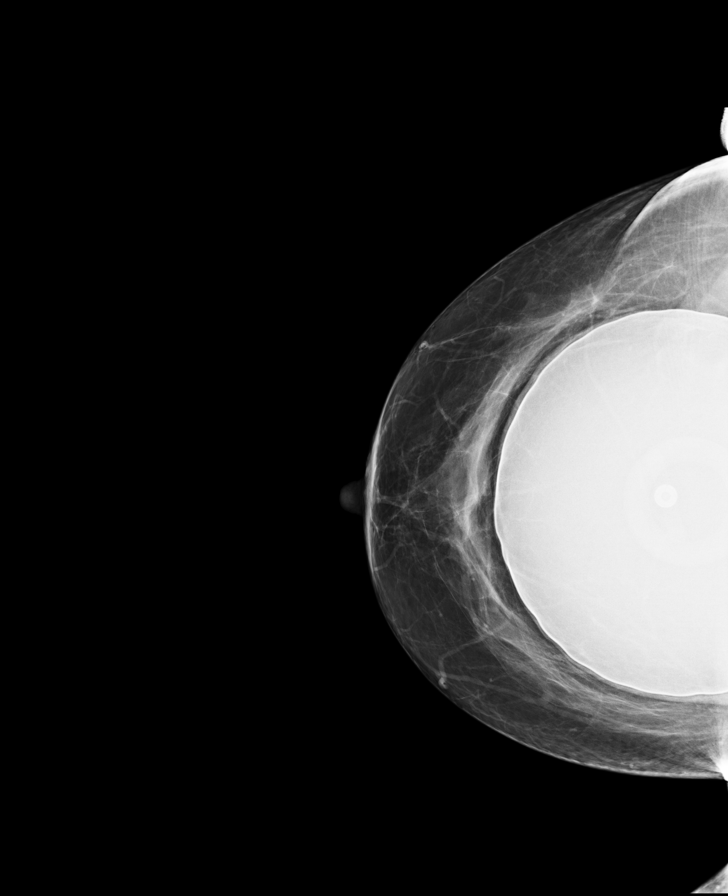

[L CC]
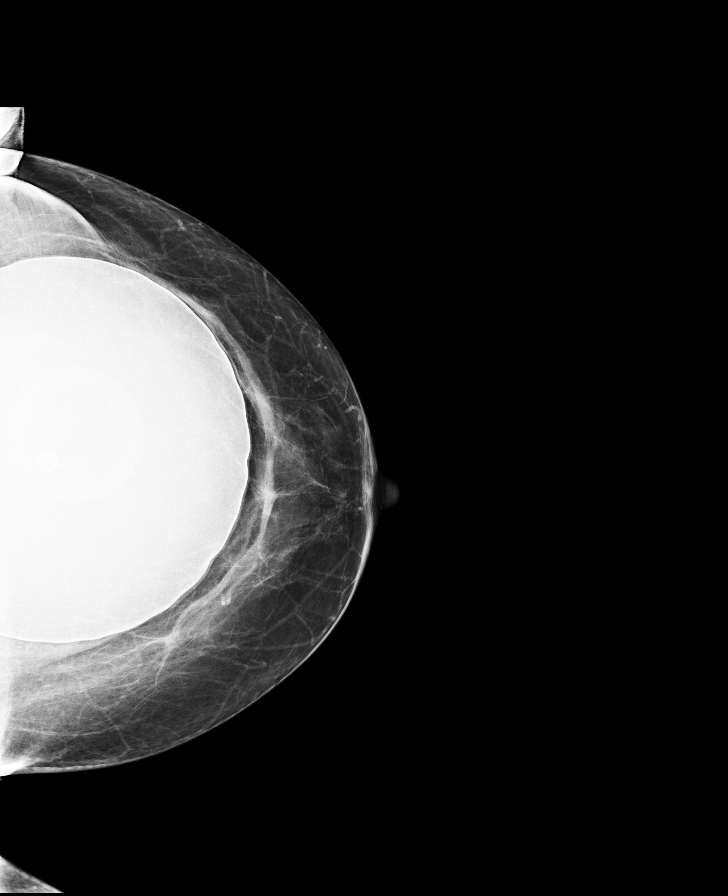

[R MLO]
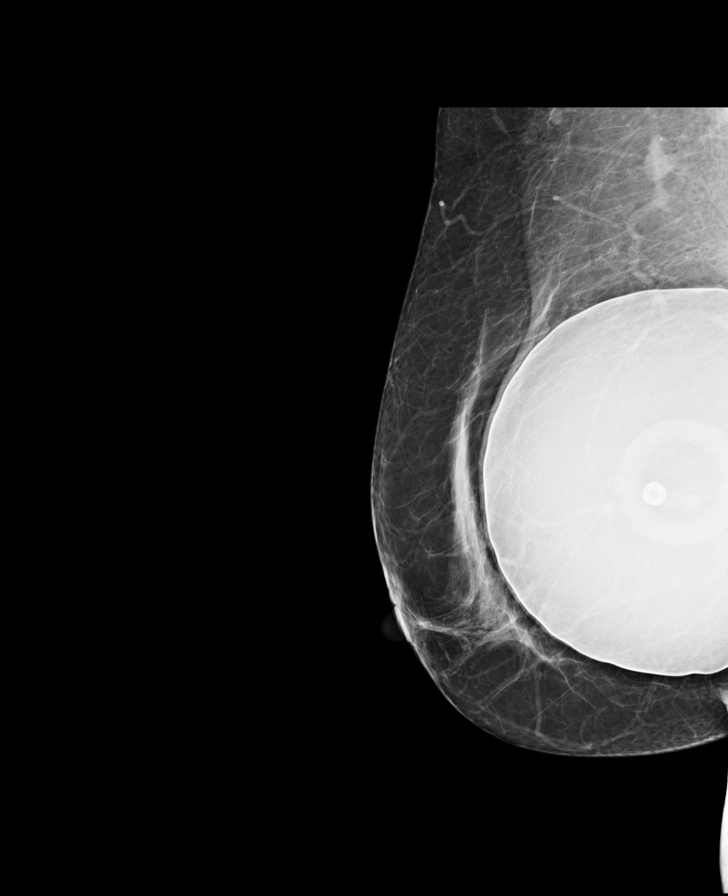

[L CC synth-2D]
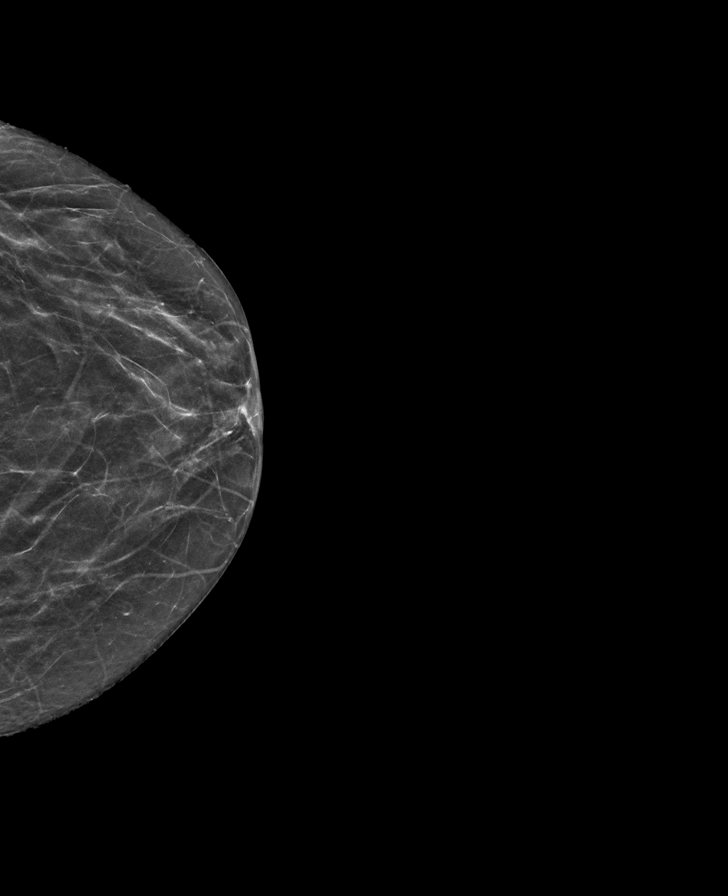

[L MLO synth-2D]
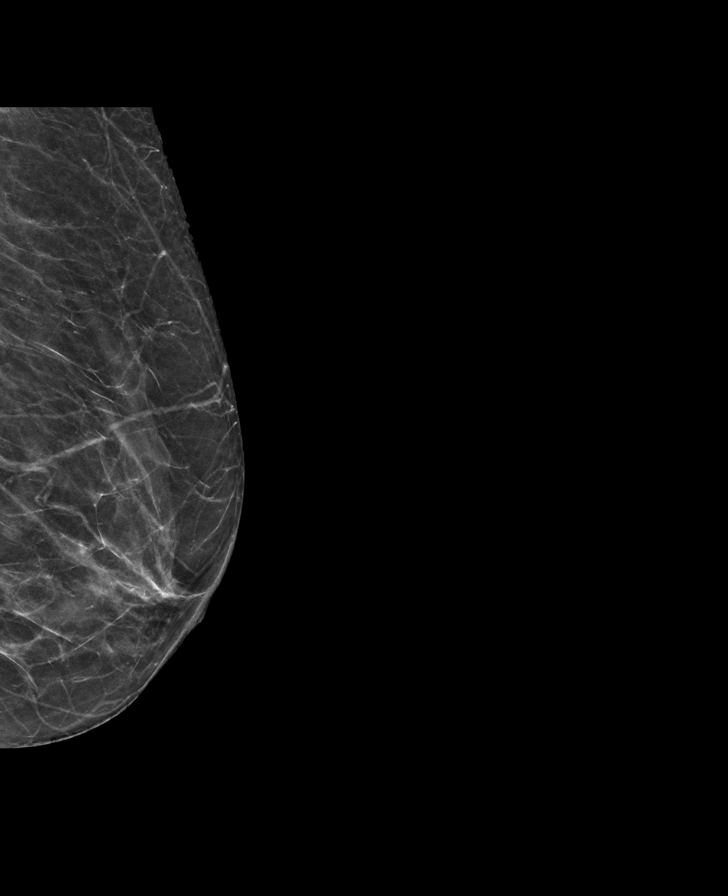

[R CC synth-2D]
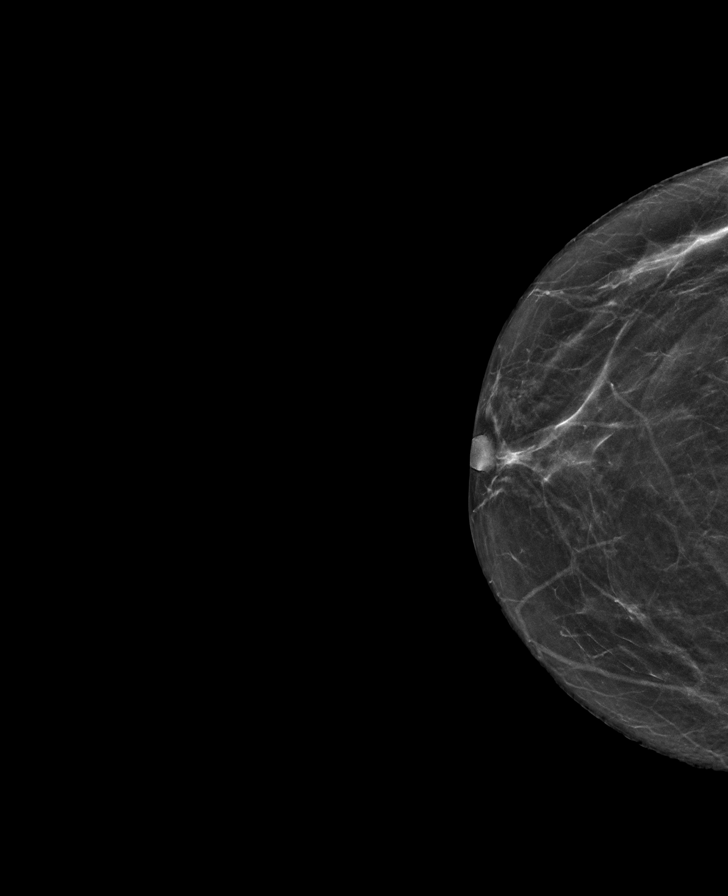

[R MLO synth-2D]
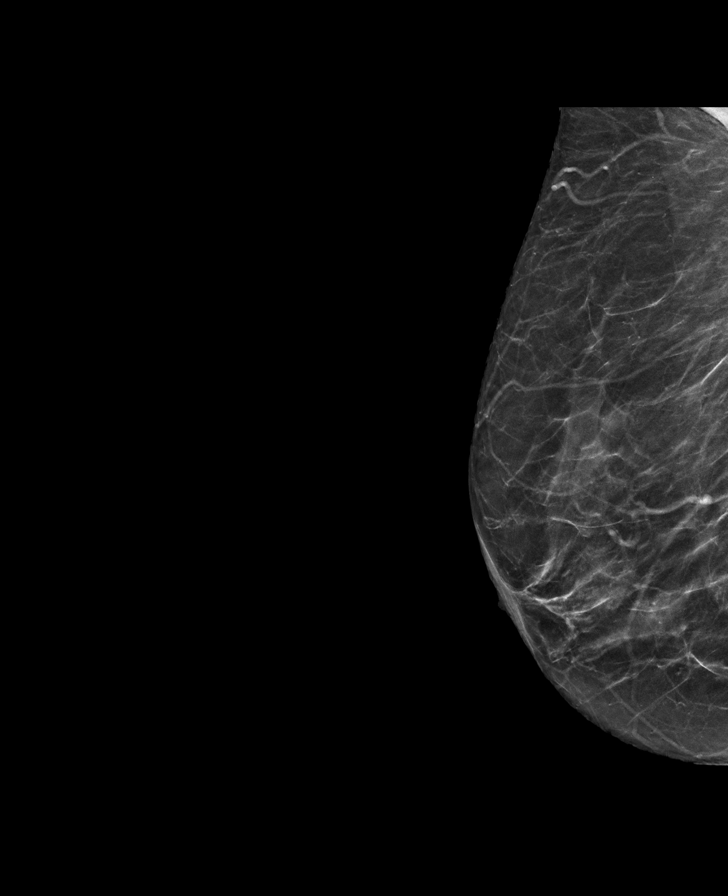

[8 of 28 positions shown; findings below may reference images not displayed]

ACR Breast Density Category b: There are scattered areas of
fibroglandular density.
FINDINGS: There are no findings suspicious for malignancy. Images were
processed with CAD.
IMPRESSION: No mammographic evidence of malignancy. A result letter of this
screening mammogram will be mailed directly to the patient.

RECOMMENDATION:
Screening mammogram in one year. (Code:60-T-8Z4)

BI-RADS CATEGORY  1:  Negative.

## 2020-09-22 ENCOUNTER — Ambulatory Visit: Payer: Medicare HMO | Admitting: Physician Assistant

## 2020-10-07 ENCOUNTER — Ambulatory Visit: Payer: Medicare HMO | Admitting: Physician Assistant

## 2020-10-13 ENCOUNTER — Other Ambulatory Visit: Payer: Self-pay

## 2020-10-13 ENCOUNTER — Encounter: Payer: Self-pay | Admitting: Physician Assistant

## 2020-10-13 ENCOUNTER — Ambulatory Visit: Payer: Medicare HMO | Admitting: Physician Assistant

## 2020-10-13 DIAGNOSIS — Z1283 Encounter for screening for malignant neoplasm of skin: Secondary | ICD-10-CM | POA: Diagnosis not present

## 2020-10-13 DIAGNOSIS — L570 Actinic keratosis: Secondary | ICD-10-CM

## 2020-10-13 DIAGNOSIS — L820 Inflamed seborrheic keratosis: Secondary | ICD-10-CM

## 2020-10-31 ENCOUNTER — Encounter: Payer: Self-pay | Admitting: Physician Assistant

## 2020-10-31 ENCOUNTER — Other Ambulatory Visit: Payer: Self-pay

## 2020-10-31 ENCOUNTER — Ambulatory Visit: Payer: Medicare HMO | Admitting: Dermatology

## 2020-10-31 DIAGNOSIS — L570 Actinic keratosis: Secondary | ICD-10-CM | POA: Diagnosis not present

## 2020-10-31 MED ORDER — AMINOLEVULINIC ACID HCL 10 % EX GEL
2000.0000 mg | Freq: Once | CUTANEOUS | Status: AC
  Administered 2020-10-31: 2000 mg via TOPICAL

## 2020-10-31 NOTE — Patient Instructions (Signed)

## 2020-10-31 NOTE — Progress Notes (Signed)
Photodynamic Therapy Procedure Note Diagnosis: Actinic keratosis Location: chest Informed Consent: Discussed risks (burning, pain, redness, peeling, severe sunburn-like reaction, blistering, discoloration, lack of resolution) and benefits of the procedure, as well as the alternatives. Informed consent was obtained. Preparation: After cleansing the skin, the area to be treated was coated with Levulan.  This was allowed to sit on the skin for 90 minutes. Procedure Details: The patient was placed under the light source with appropriate eye protection for 20 minutes. After completing the treatment, the patient applied sunscreen to the treated areas. Patient tolerated the procedure well Plan: Avoid any sun exposure for the next 24 hours. Wear sunscreen daily for the next week. Observe normal sun precautions thereafter. Recommend OTC analgesia as needed for pain. Follow-up in 10 weeks.

## 2020-10-31 NOTE — Progress Notes (Signed)
   Follow-Up Visit   Subjective  Jennifer Ochoa is a 65 y.o. female who presents for the following: Skin Problem (Right cheek tan spot tratmn tretionon 0.05%).   The following portions of the chart were reviewed this encounter and updated as appropriate:  Tobacco  Allergies  Meds  Problems  Med Hx  Surg Hx  Fam Hx      Objective  Well appearing patient in no apparent distress; mood and affect are within normal limits.  All skin waist up examined.  Objective  Left Malar Cheek, Right Malar Cheek: Erythematous stuck-on, waxy papule or plaque.   Objective  Chest - Medial [facility]): Erythematous patches with gritty scale.  Objective  Right Breast: Waist up skin examination- no atypical mole or non mole skin cancer   Assessment & Plan  Seborrheic keratosis, inflamed (2) Left Malar Cheek; Right Malar Cheek  Destruction of lesion - Left Malar Cheek, Right Malar Cheek Complexity: simple   Destruction method: cryotherapy   Informed consent: discussed and consent obtained   Timeout:  patient name, date of birth, surgical site, and procedure verified Lesion destroyed using liquid nitrogen: Yes   Cryotherapy cycles:  3 Outcome: patient tolerated procedure well with no complications    AK (actinic keratosis) Chest - Medial (Center)  Patient will schedule PDT for chest  Encounter for screening for malignant neoplasm of skin Right Breast  Yearly skin check    I, Makell Drohan, PA-C, have reviewed all documentation's for this visit.  The documentation on 10/31/20 for the exam, diagnosis, procedures and orders are all accurate and complete.

## 2020-11-05 ENCOUNTER — Encounter: Payer: Self-pay | Admitting: Dermatology

## 2020-11-05 NOTE — Progress Notes (Signed)
I, Lavonna Monarch, MD, have reviewed all documentation for this visit. The documentation on 11/05/20 for the exam, diagnosis, procedures, and orders are all accurate and complete.

## 2020-12-08 ENCOUNTER — Ambulatory Visit: Payer: Medicare HMO | Admitting: Physician Assistant

## 2020-12-29 DIAGNOSIS — L309 Dermatitis, unspecified: Secondary | ICD-10-CM | POA: Diagnosis not present

## 2021-01-18 ENCOUNTER — Ambulatory Visit: Payer: Medicare HMO | Admitting: Physician Assistant

## 2021-02-09 DIAGNOSIS — M1712 Unilateral primary osteoarthritis, left knee: Secondary | ICD-10-CM | POA: Diagnosis not present

## 2021-02-09 DIAGNOSIS — M180 Bilateral primary osteoarthritis of first carpometacarpal joints: Secondary | ICD-10-CM | POA: Diagnosis not present

## 2021-02-09 DIAGNOSIS — M1811 Unilateral primary osteoarthritis of first carpometacarpal joint, right hand: Secondary | ICD-10-CM | POA: Diagnosis not present

## 2021-02-09 DIAGNOSIS — M1711 Unilateral primary osteoarthritis, right knee: Secondary | ICD-10-CM | POA: Diagnosis not present

## 2021-02-09 DIAGNOSIS — M1812 Unilateral primary osteoarthritis of first carpometacarpal joint, left hand: Secondary | ICD-10-CM | POA: Diagnosis not present

## 2021-05-31 DIAGNOSIS — Z0000 Encounter for general adult medical examination without abnormal findings: Secondary | ICD-10-CM | POA: Diagnosis not present

## 2021-05-31 DIAGNOSIS — Z1389 Encounter for screening for other disorder: Secondary | ICD-10-CM | POA: Diagnosis not present

## 2021-05-31 DIAGNOSIS — M19039 Primary osteoarthritis, unspecified wrist: Secondary | ICD-10-CM | POA: Diagnosis not present

## 2021-06-23 DIAGNOSIS — Z9189 Other specified personal risk factors, not elsewhere classified: Secondary | ICD-10-CM | POA: Diagnosis not present

## 2021-06-23 DIAGNOSIS — Z1152 Encounter for screening for COVID-19: Secondary | ICD-10-CM | POA: Diagnosis not present

## 2021-06-23 DIAGNOSIS — J069 Acute upper respiratory infection, unspecified: Secondary | ICD-10-CM | POA: Diagnosis not present

## 2021-06-23 DIAGNOSIS — J029 Acute pharyngitis, unspecified: Secondary | ICD-10-CM | POA: Diagnosis not present

## 2021-06-24 ENCOUNTER — Other Ambulatory Visit: Payer: Self-pay

## 2021-06-24 ENCOUNTER — Emergency Department (HOSPITAL_BASED_OUTPATIENT_CLINIC_OR_DEPARTMENT_OTHER): Payer: Medicare HMO

## 2021-06-24 ENCOUNTER — Emergency Department (HOSPITAL_BASED_OUTPATIENT_CLINIC_OR_DEPARTMENT_OTHER)
Admission: EM | Admit: 2021-06-24 | Discharge: 2021-06-24 | Disposition: A | Discharge status: Home / Self Care | Payer: Medicare HMO | Attending: Emergency Medicine | Admitting: Emergency Medicine

## 2021-06-24 ENCOUNTER — Encounter (HOSPITAL_BASED_OUTPATIENT_CLINIC_OR_DEPARTMENT_OTHER): Payer: Self-pay | Admitting: Emergency Medicine

## 2021-06-24 DIAGNOSIS — R002 Palpitations: Secondary | ICD-10-CM

## 2021-06-24 DIAGNOSIS — J069 Acute upper respiratory infection, unspecified: Secondary | ICD-10-CM

## 2021-06-24 DIAGNOSIS — R0602 Shortness of breath: Secondary | ICD-10-CM | POA: Diagnosis not present

## 2021-06-24 DIAGNOSIS — R059 Cough, unspecified: Secondary | ICD-10-CM | POA: Diagnosis not present

## 2021-06-24 LAB — BASIC METABOLIC PANEL
Anion gap: 11 (ref 5–15)
BUN: 14 mg/dL (ref 8–23)
CO2: 25 mmol/L (ref 22–32)
Calcium: 9.6 mg/dL (ref 8.9–10.3)
Chloride: 101 mmol/L (ref 98–111)
Creatinine, Ser: 0.72 mg/dL (ref 0.44–1.00)
GFR, Estimated: >60 mL/min (ref >60)
Glucose, Bld: 107 mg/dL — ABNORMAL HIGH (ref 70–99)
Potassium: 3.8 mmol/L (ref 3.5–5.1)
Sodium: 137 mmol/L (ref 135–145)

## 2021-06-24 LAB — CBC
HCT: 37 % (ref 36.0–46.0)
Hemoglobin: 12.5 g/dL (ref 12.0–15.0)
MCH: 32.5 pg (ref 26.0–34.0)
MCHC: 33.8 g/dL (ref 30.0–36.0)
MCV: 96.1 fL (ref 80.0–100.0)
Platelets: 203 10*3/uL (ref 150–400)
RBC: 3.85 MIL/uL — ABNORMAL LOW (ref 3.87–5.11)
RDW: 12.3 % (ref 11.5–15.5)
WBC: 12.7 10*3/uL — ABNORMAL HIGH (ref 4.0–10.5)
nRBC: 0 % (ref 0.0–0.2)

## 2021-06-24 MED ORDER — BENZONATATE 100 MG PO CAPS: 100.0000 mg | ORAL_CAPSULE | Freq: Three times a day (TID) | ORAL | 0 refills | Status: AC

## 2021-06-24 NOTE — ED Notes (Signed)
No acute distress noted upon this RN's departure of patient. Verified discharge paperwork with name and DOB. Vital signs stable. Patient taken to checkout window. Discharge paperwork discussed with patient. No further questions voiced upon discharge.

## 2021-06-24 NOTE — ED Provider Notes (Signed)
[facility] EMERGENCY DEPT Provider Note   CSN: [id number] Arrival date & time: 06/24/21  1459     History Chief Complaint  Patient presents with   Palpitations    Jennifer Ochoa is a 65 y.o. female.   Palpitations  Patient presents emergency room for evaluations of palpitations.  Patient states she darted having URI type symptoms earlier this week.  She has been coughing.  She had a sore throat.  She developed hoarseness.  Patient went to an urgent care.  She states she never had a full evaluation but she did have a strep test and a COVID test performed.  Those were both negative.  Patient was given prescriptions for a cough medicine.  Patient states that in the last couple of days she has had continued episodes of coughing spells but during these episodes she will notice her heart beating fast and irregularly and she will become very short of breath.  She has had a few those episodes so she came to the ED today.  At this time she is not experiencing the palpitations or shortness of breath.  She does not have any history of any cardiac problems  Past Medical History:  Diagnosis Date   Allergic rhinitis    Atypical nevus 03/31/2013   mild atypia on right upper abdomen   Atypical nevus of buttock 07/29/2012   moderate atypia on right buttock   Insomnia    Menopause    Stroke [facility])     Patient Active Problem List   Diagnosis Date Noted   Coronary atherosclerosis of unspecified type of vessel, native or graft 08/13/2013   Unspecified transient cerebral ischemia 08/13/2013   Cervicalgia 08/13/2013   Dissection of carotid artery ([place]) 08/13/2013    Past Surgical History:  Procedure Laterality Date   APPENDECTOMY  [place] OF BREAST IMPLANTS  2001     OB History   No obstetric history on file.     Family History  Problem Relation Age of Onset   Heart attack Mother    CVA Mother    Lung cancer Father    Prostate  cancer Father     Social History   Tobacco Use   Smoking status: Never   Smokeless tobacco: Never  Vaping Use   Vaping Use: Never used  Substance Use Topics   Alcohol use: Not Currently   Drug use: Never    Home Medications Prior to Admission medications   Medication Sig Start Date End Date Taking? Authorizing Provider  benzonatate (TESSALON) 100 MG capsule Take 1 capsule (100 mg total) by mouth every 8 (eight) hours. 06/24/21  Yes Dorie Rank, MD  doxycycline (VIBRA-TABS) 100 MG tablet Take 1 tablet (100 mg total) by mouth 2 (two) times daily. Patient not taking: Reported on 10/13/2020 04/18/20   Wallene Huh, DPM    Allergies    Patient has no known allergies.  Review of Systems   Review of Systems  Cardiovascular:  Positive for palpitations.  All other systems reviewed and are negative.  Physical Exam Updated Vital Signs BP 135/83   Pulse 71   Resp 14   Ht 1.702 m (5\' 7" )   Wt 66.7 kg   SpO2 97%   BMI 23.02 kg/m   Physical Exam Vitals and nursing note reviewed.  Constitutional:      General: She is not in acute distress.    Appearance: She is well-developed.  HENT:  Head: Normocephalic and atraumatic.     Right Ear: External ear normal.     Left Ear: External ear normal.     Mouth/Throat:     Mouth: Mucous membranes are moist.     Pharynx: No oropharyngeal exudate or posterior oropharyngeal erythema.     Comments: Voice is hoarse Eyes:     General: No scleral icterus.       Right eye: No discharge.        Left eye: No discharge.     Conjunctiva/sclera: Conjunctivae normal.  Neck:     Trachea: No tracheal deviation.  Cardiovascular:     Rate and Rhythm: Normal rate and regular rhythm.  Pulmonary:     Effort: Pulmonary effort is normal. No respiratory distress.     Breath sounds: Normal breath sounds. No stridor. No wheezing or rales.  Abdominal:     General: Bowel sounds are normal. There is no distension.     Palpations: Abdomen is soft.      Tenderness: There is no abdominal tenderness. There is no guarding or rebound.  Musculoskeletal:        General: No tenderness or deformity.     Cervical back: Neck supple.  Skin:    General: Skin is warm and dry.     Findings: No rash.  Neurological:     General: No focal deficit present.     Mental Status: She is alert.     Cranial Nerves: No cranial nerve deficit (no facial droop, extraocular movements intact, no slurred speech).     Sensory: No sensory deficit.     Motor: No abnormal muscle tone or seizure activity.     Coordination: Coordination normal.  Psychiatric:        Mood and Affect: Mood normal.    ED Results / Procedures / Treatments   Labs (all labs ordered are listed, but only abnormal results are displayed) Labs Reviewed  CBC - Abnormal; Notable for the following components:      Result Value   WBC 12.7 (*)    RBC 3.85 (*)    All other components within normal limits  BASIC METABOLIC PANEL - Abnormal; Notable for the following components:   Glucose, Bld 107 (*)    All other components within normal limits    EKG EKG Interpretation  Date/Time:  Saturday June 24 2021 15:07:01 EDT Ventricular Rate:  78 PR Interval:  159 QRS Duration: 92 QT Interval:  392 QTC Calculation: 447 R Axis:   -5 Text Interpretation: Sinus rhythm No significant change since last tracing Confirmed by Dorie Rank [phone]) on 06/24/2021 3:25:04 PM  Radiology DG Chest Portable 1 View  Result Date: 06/24/2021 CLINICAL DATA:  Cough and shortness of breath EXAM: PORTABLE CHEST 1 VIEW COMPARISON:  None. FINDINGS: Numerous leads and wires project over the chest. Midline trachea. Normal heart size and mediastinal contours. No pleural effusion or pneumothorax. Clear lungs. Right upper abdominal surgical clips. IMPRESSION: No acute cardiopulmonary disease. Electronically Signed   By: Abigail Miyamoto M.D.   On: 06/24/2021 15:52    Procedures Procedures   Medications Ordered in  ED Medications - No data to display  ED Course  I have reviewed the triage vital signs and the nursing notes.  Pertinent labs & imaging results that were available during my care of the patient were reviewed by me and considered in my medical decision making (see chart for details).  Clinical Course as of 06/24/21 1624  Sat Jun 24, 2021  1608 White blood cell count elevated.  Metabolic panel unremarkable. [VO]  5929 X-ray without acute findings [JK]    Clinical Course User Index [JK] Dorie Rank, MD   MDM Rules/Calculators/A&P                           Presented with complaints of URI symptoms and palpitations.  Patient states these episodes have occurred after coughing spells.  In the emergency room she is not having any cardiac dysrhythmia.  She is in sinus rhythm.  She was placed on the cardiac monitor during her ED stay and there have been no dysrhythmia noted.  Patient's laboratory tests are unremarkable.  She does have a slight elevation white blood cell count but she was placed on steroids at the urgent care.  Patient was taking a prescription for hydrocodone and chlorphentermine.  Is possible this could be contributing to some of her palpitations.  We will have her discontinue that and start taking Tessalon for her cough.  Discussed follow-up with her primary care doctor for possible cardiac monitoring if the symptoms persist Final Clinical Impression(s) / ED Diagnoses Final diagnoses:  Upper respiratory tract infection, unspecified type  Palpitation    Rx / DC Orders ED Discharge Orders          Ordered    benzonatate (TESSALON) 100 MG capsule  Every 8 hours        06/24/21 1624             Dorie Rank, MD 06/24/21 1626

## 2021-06-24 NOTE — Discharge Instructions (Signed)
The test today in the ED were reassuring.  No signs of pneumonia.  Your heart rhythm is normal.  Consider taking the Tessalon for your cough instead of the cough syrup.  Follow-up with your doctor to be rechecked if you continue to experience episodes of palpitations.  This could be looked into further with an outpatient cardiac monitor.

## 2021-06-24 NOTE — ED Notes (Signed)
First contact with patient. Patient arrived via triage from home with complaints of shortness of breath and palpitations after taking cold medicine with codeine in it. Patient denies all of these symptoms at this time but does have a noted dry cough - states she has had this x 5 days. Patient is A&OX 4. Respirations even/unlabored and speaking in full sentences. Patient changed into gown and placed on cardiac monitor and call light within reach. Patient updated on plan of care. Will continue to monitor patient.

## 2021-06-24 NOTE — ED Triage Notes (Signed)
Feeling palpitation , cold symptoms x 5 days .  Denies chest pain. Intermittent shortness of breath .

## 2021-07-24 DIAGNOSIS — H5213 Myopia, bilateral: Secondary | ICD-10-CM | POA: Diagnosis not present

## 2021-07-24 DIAGNOSIS — Z0100 Encounter for examination of eyes and vision without abnormal findings: Secondary | ICD-10-CM | POA: Diagnosis not present

## 2021-07-27 DIAGNOSIS — Z1231 Encounter for screening mammogram for malignant neoplasm of breast: Secondary | ICD-10-CM | POA: Diagnosis not present

## 2021-07-27 DIAGNOSIS — E041 Nontoxic single thyroid nodule: Secondary | ICD-10-CM | POA: Diagnosis not present

## 2021-07-27 DIAGNOSIS — Z6823 Body mass index (BMI) 23.0-23.9, adult: Secondary | ICD-10-CM | POA: Diagnosis not present

## 2021-07-27 DIAGNOSIS — Z124 Encounter for screening for malignant neoplasm of cervix: Secondary | ICD-10-CM | POA: Diagnosis not present

## 2021-07-28 ENCOUNTER — Other Ambulatory Visit: Payer: Self-pay | Admitting: Obstetrics and Gynecology

## 2021-07-28 DIAGNOSIS — E041 Nontoxic single thyroid nodule: Secondary | ICD-10-CM

## 2021-08-10 ENCOUNTER — Ambulatory Visit
Admission: RE | Admit: 2021-08-10 | Discharge: 2021-08-10 | Disposition: A | Discharge status: Home / Self Care | Payer: Medicare HMO | Source: Ambulatory Visit | Attending: Obstetrics and Gynecology | Admitting: Obstetrics and Gynecology

## 2021-08-10 DIAGNOSIS — E041 Nontoxic single thyroid nodule: Secondary | ICD-10-CM

## 2021-08-18 DIAGNOSIS — J0100 Acute maxillary sinusitis, unspecified: Secondary | ICD-10-CM | POA: Diagnosis not present

## 2021-09-13 DIAGNOSIS — Z9189 Other specified personal risk factors, not elsewhere classified: Secondary | ICD-10-CM | POA: Diagnosis not present

## 2021-09-13 DIAGNOSIS — Z803 Family history of malignant neoplasm of breast: Secondary | ICD-10-CM | POA: Diagnosis not present

## 2021-09-13 DIAGNOSIS — Z809 Family history of malignant neoplasm, unspecified: Secondary | ICD-10-CM | POA: Diagnosis not present

## 2021-11-27 ENCOUNTER — Encounter: Payer: Self-pay | Admitting: Dermatology

## 2021-11-27 ENCOUNTER — Other Ambulatory Visit: Payer: Self-pay

## 2021-11-27 ENCOUNTER — Ambulatory Visit: Payer: Medicare HMO | Admitting: Dermatology

## 2021-11-27 DIAGNOSIS — L570 Actinic keratosis: Secondary | ICD-10-CM

## 2021-11-27 MED ORDER — TRETINOIN 0.025 % EX CREA
TOPICAL_CREAM | Freq: Every day | CUTANEOUS | 9 refills | Status: AC
Start: 2021-11-27 — End: 2022-11-27

## 2021-11-27 NOTE — Patient Instructions (Signed)
CeraVe cream

## 2021-12-05 DIAGNOSIS — N958 Other specified menopausal and perimenopausal disorders: Secondary | ICD-10-CM | POA: Diagnosis not present

## 2021-12-09 ENCOUNTER — Encounter: Payer: Self-pay | Admitting: Dermatology

## 2021-12-09 NOTE — Progress Notes (Signed)
? ?  Follow-Up Visit ?  ?Subjective  ?Jennifer Ochoa is a 66 y.o. female who presents for the following: Photodynamic Therapy (Pt here for PDT on chest and face. ). ? ?Actinic keratoses for possible PDT therapy ?Location:  ?Duration:  ?Quality:  ?Associated Signs/Symptoms: ?Modifying Factors:  ?Severity:  ?Timing: ?Context:  ? ?Objective  ?Well appearing patient in no apparent distress; mood and affect are within normal limits. ?Chest - Medial [facility]), Head - Anterior (Face) ?Although patient was scheduled for possible photodynamic light therapy.  Her current ultraviolet damage is really fairly subtle with minimal small actinic keratoses and more aesthetic light damage. ? ? ? ?A focused examination was performed including chest, head, neck. Relevant physical exam findings are noted in the Assessment and Plan. ? ? ?Assessment & Plan  ? ? ?AK (actinic keratosis) (2) ?Head - Anterior (Face); Chest - Medial [facility]) ? ?After detailed discussion about all treatment options, patient comfortable trying a topical retinoid for the next 6 to 12 months.  We will progress from every other day to daily therapy with low strength and contact me when it is time for refill to decide on going to a higher concentration.  Follow-up 1 year. ? ?tretinoin (RETIN-A) 0.025 % cream - Chest - Medial (Center), Head - Anterior (Face) ?Apply topically at bedtime. ? ? ? ? ? ?I, Lavonna Monarch, MD, have reviewed all documentation for this visit.  The documentation on 12/09/21 for the exam, diagnosis, procedures, and orders are all accurate and complete. ?

## 2022-02-27 DIAGNOSIS — E041 Nontoxic single thyroid nodule: Secondary | ICD-10-CM | POA: Diagnosis not present

## 2022-02-27 DIAGNOSIS — M180 Bilateral primary osteoarthritis of first carpometacarpal joints: Secondary | ICD-10-CM | POA: Diagnosis not present

## 2022-02-27 DIAGNOSIS — M79641 Pain in right hand: Secondary | ICD-10-CM | POA: Diagnosis not present

## 2022-02-27 DIAGNOSIS — M79642 Pain in left hand: Secondary | ICD-10-CM | POA: Diagnosis not present

## 2022-05-14 DIAGNOSIS — U071 COVID-19: Secondary | ICD-10-CM | POA: Diagnosis not present

## 2022-07-04 DIAGNOSIS — Z0000 Encounter for general adult medical examination without abnormal findings: Secondary | ICD-10-CM | POA: Diagnosis not present

## 2022-07-04 DIAGNOSIS — Z23 Encounter for immunization: Secondary | ICD-10-CM | POA: Diagnosis not present

## 2022-07-04 DIAGNOSIS — Z131 Encounter for screening for diabetes mellitus: Secondary | ICD-10-CM | POA: Diagnosis not present

## 2022-07-04 DIAGNOSIS — Z1331 Encounter for screening for depression: Secondary | ICD-10-CM | POA: Diagnosis not present

## 2022-07-04 DIAGNOSIS — J309 Allergic rhinitis, unspecified: Secondary | ICD-10-CM | POA: Diagnosis not present

## 2022-07-24 DIAGNOSIS — H52223 Regular astigmatism, bilateral: Secondary | ICD-10-CM | POA: Diagnosis not present

## 2022-07-26 DIAGNOSIS — R051 Acute cough: Secondary | ICD-10-CM | POA: Diagnosis not present

## 2022-07-26 DIAGNOSIS — Z03818 Encounter for observation for suspected exposure to other biological agents ruled out: Secondary | ICD-10-CM | POA: Diagnosis not present

## 2022-07-26 DIAGNOSIS — J069 Acute upper respiratory infection, unspecified: Secondary | ICD-10-CM | POA: Diagnosis not present

## 2022-07-26 DIAGNOSIS — J029 Acute pharyngitis, unspecified: Secondary | ICD-10-CM | POA: Diagnosis not present

## 2022-08-08 DIAGNOSIS — Z01419 Encounter for gynecological examination (general) (routine) without abnormal findings: Secondary | ICD-10-CM | POA: Diagnosis not present

## 2022-08-08 DIAGNOSIS — Z6825 Body mass index (BMI) 25.0-25.9, adult: Secondary | ICD-10-CM | POA: Diagnosis not present

## 2022-08-08 DIAGNOSIS — Z1231 Encounter for screening mammogram for malignant neoplasm of breast: Secondary | ICD-10-CM | POA: Diagnosis not present

## 2022-08-14 ENCOUNTER — Other Ambulatory Visit: Payer: Self-pay | Admitting: Obstetrics and Gynecology

## 2022-08-14 DIAGNOSIS — Z803 Family history of malignant neoplasm of breast: Secondary | ICD-10-CM

## 2022-10-16 ENCOUNTER — Other Ambulatory Visit: Payer: Self-pay | Admitting: Obstetrics and Gynecology

## 2022-10-16 DIAGNOSIS — Z803 Family history of malignant neoplasm of breast: Secondary | ICD-10-CM

## 2022-11-06 ENCOUNTER — Ambulatory Visit
Admission: RE | Admit: 2022-11-06 | Discharge: 2022-11-06 | Disposition: A | Discharge status: Home / Self Care | Payer: PPO | Source: Ambulatory Visit | Attending: Obstetrics and Gynecology | Admitting: Obstetrics and Gynecology

## 2022-11-06 DIAGNOSIS — Z803 Family history of malignant neoplasm of breast: Secondary | ICD-10-CM

## 2022-11-06 DIAGNOSIS — N6489 Other specified disorders of breast: Secondary | ICD-10-CM | POA: Diagnosis not present

## 2022-11-06 MED ORDER — GADOPICLENOL 0.5 MMOL/ML IV SOLN
6.5000 mL | Freq: Once | INTRAVENOUS | Status: AC | PRN
  Administered 2022-11-06: 6.5 mL via INTRAVENOUS

## 2022-12-31 DIAGNOSIS — L814 Other melanin hyperpigmentation: Secondary | ICD-10-CM | POA: Diagnosis not present

## 2022-12-31 DIAGNOSIS — D225 Melanocytic nevi of trunk: Secondary | ICD-10-CM | POA: Diagnosis not present

## 2022-12-31 DIAGNOSIS — L821 Other seborrheic keratosis: Secondary | ICD-10-CM | POA: Diagnosis not present

## 2023-03-21 DIAGNOSIS — M25552 Pain in left hip: Secondary | ICD-10-CM | POA: Diagnosis not present

## 2023-04-11 DIAGNOSIS — R6882 Decreased libido: Secondary | ICD-10-CM | POA: Diagnosis not present

## 2023-04-11 DIAGNOSIS — Z79890 Hormone replacement therapy (postmenopausal): Secondary | ICD-10-CM | POA: Diagnosis not present

## 2023-04-11 DIAGNOSIS — G4700 Insomnia, unspecified: Secondary | ICD-10-CM | POA: Diagnosis not present

## 2023-05-28 NOTE — Progress Notes (Signed)
discussed

## 2023-06-24 DIAGNOSIS — M7062 Trochanteric bursitis, left hip: Secondary | ICD-10-CM | POA: Diagnosis not present

## 2023-07-01 DIAGNOSIS — M7062 Trochanteric bursitis, left hip: Secondary | ICD-10-CM | POA: Diagnosis not present

## 2023-07-08 DIAGNOSIS — M7062 Trochanteric bursitis, left hip: Secondary | ICD-10-CM | POA: Diagnosis not present

## 2023-07-16 DIAGNOSIS — M7062 Trochanteric bursitis, left hip: Secondary | ICD-10-CM | POA: Diagnosis not present

## 2023-07-23 DIAGNOSIS — M7062 Trochanteric bursitis, left hip: Secondary | ICD-10-CM | POA: Diagnosis not present

## 2023-07-26 DIAGNOSIS — Z1331 Encounter for screening for depression: Secondary | ICD-10-CM | POA: Diagnosis not present

## 2023-07-26 DIAGNOSIS — Z0000 Encounter for general adult medical examination without abnormal findings: Secondary | ICD-10-CM | POA: Diagnosis not present

## 2023-07-26 DIAGNOSIS — Z131 Encounter for screening for diabetes mellitus: Secondary | ICD-10-CM | POA: Diagnosis not present

## 2023-07-26 DIAGNOSIS — J309 Allergic rhinitis, unspecified: Secondary | ICD-10-CM | POA: Diagnosis not present

## 2023-08-13 DIAGNOSIS — M7062 Trochanteric bursitis, left hip: Secondary | ICD-10-CM | POA: Diagnosis not present

## 2023-08-26 DIAGNOSIS — M7062 Trochanteric bursitis, left hip: Secondary | ICD-10-CM | POA: Diagnosis not present

## 2023-10-15 NOTE — Progress Notes (Signed)
.  POV

## 2023-10-20 ENCOUNTER — Emergency Department (HOSPITAL_BASED_OUTPATIENT_CLINIC_OR_DEPARTMENT_OTHER)
Admission: EM | Admit: 2023-10-20 | Discharge: 2023-10-20 | Disposition: A | Discharge status: Home / Self Care | Payer: PPO | Attending: Emergency Medicine | Admitting: Emergency Medicine

## 2023-10-20 ENCOUNTER — Encounter (HOSPITAL_BASED_OUTPATIENT_CLINIC_OR_DEPARTMENT_OTHER): Payer: Self-pay

## 2023-10-20 DIAGNOSIS — Z1152 Encounter for screening for COVID-19: Secondary | ICD-10-CM | POA: Diagnosis not present

## 2023-10-20 DIAGNOSIS — J069 Acute upper respiratory infection, unspecified: Secondary | ICD-10-CM | POA: Diagnosis present

## 2023-10-20 DIAGNOSIS — J019 Acute sinusitis, unspecified: Secondary | ICD-10-CM | POA: Diagnosis not present

## 2023-10-20 LAB — RESP PANEL BY RT-PCR (RSV, FLU A&B, COVID)  RVPGX2
Influenza A by PCR: NEGATIVE
Influenza B by PCR: NEGATIVE
Resp Syncytial Virus by PCR: NEGATIVE
SARS Coronavirus 2 by RT PCR: NEGATIVE

## 2023-10-20 MED ORDER — AMOXICILLIN-POT CLAVULANATE 875-125 MG PO TABS: 1.0000 | ORAL_TABLET | Freq: Two times a day (BID) | ORAL | 0 refills | Status: AC

## 2023-10-20 NOTE — ED Triage Notes (Signed)
 She c/o uri sx plus some facial pressure with much "yellow" nasal drainage x 10 days. She is ambulatory and in no distress.

## 2023-10-20 NOTE — ED Provider Notes (Signed)
 [place] EMERGENCY DEPARTMENT AT [facility] Provider Note   CSN: [id number] Arrival date & time: 10/20/23  9247     History  Chief Complaint  Patient presents with   URI    Jennifer Ochoa is a 68 y.o. female.  Patient here with sinus pressure for the last few days.  With some drainage.  Think she has sinusitis.  History of the same.  Nothing makes it worse or better.  No significant medical history otherwise.  No chest pain shortness of breath cough sputum production fever or chills.  The history is provided by the patient.       Home Medications Prior to Admission medications   Medication Sig Start Date End Date Taking? Authorizing Provider  amoxicillin -clavulanate (AUGMENTIN ) 875-125 MG tablet Take 1 tablet by mouth every 12 (twelve) hours. 10/20/23  Yes Jordanna Hendrie, DO  benzonatate  (TESSALON ) 100 MG capsule Take 1 capsule (100 mg total) by mouth every 8 (eight) hours. Patient not taking: Reported on 11/27/2021 06/24/21   Randol Simmonds, MD  doxycycline  (VIBRA -TABS) 100 MG tablet Take 1 tablet (100 mg total) by mouth 2 (two) times daily. Patient not taking: Reported on 10/13/2020 04/18/20   Magdalen Pasco RAMAN, DPM      Allergies    Patient has no known allergies.    Review of Systems   Review of Systems  Physical Exam Updated Vital Signs BP 136/71 (BP Location: Right Arm)   Pulse 71   Temp 98.2 F (36.8 C)   Resp 20   SpO2 96%  Physical Exam Vitals and nursing note reviewed.  Constitutional:      General: She is not in acute distress.    Appearance: She is well-developed. She is not ill-appearing.  HENT:     Head: Normocephalic and atraumatic.     Nose: Congestion present.     Mouth/Throat:     Mouth: Mucous membranes are moist.  Eyes:     Extraocular Movements: Extraocular movements intact.     Conjunctiva/sclera: Conjunctivae normal.     Pupils: Pupils are equal, round, and reactive to light.  Cardiovascular:     Rate and Rhythm: Normal rate and  regular rhythm.     Heart sounds: No murmur heard. Pulmonary:     Effort: Pulmonary effort is normal. No respiratory distress.     Breath sounds: Normal breath sounds.  Abdominal:     Palpations: Abdomen is soft.     Tenderness: There is no abdominal tenderness.  Musculoskeletal:        General: No swelling.     Cervical back: Neck supple.  Skin:    General: Skin is warm and dry.     Capillary Refill: Capillary refill takes less than 2 seconds.  Neurological:     Mental Status: She is alert.  Psychiatric:        Mood and Affect: Mood normal.     ED Results / Procedures / Treatments   Labs (all labs ordered are listed, but only abnormal results are displayed) Labs Reviewed  RESP PANEL BY RT-PCR (RSV, FLU A&B, COVID)  [id number]    EKG None  Radiology No results found.  Procedures Procedures    Medications Ordered in ED Medications - No data to display  ED Course/ Medical Decision Making/ A&P                                 Medical Decision Making  Risk Prescription drug management.   Jennifer Ochoa is here with sinus congestion.  Normal vitals.  No fever.  No significant medical history.  Well-appearing.  Differential diagnosis likely viral process versus bacterial sinusitis.  Will check for COVID flu RSV and treat with antibiotic for sinus infection.  No signs of throat infection.  No respiratory symptoms.  No respiratory distress.  Overall discharged in good condition.  Understands return precautions.  This chart was dictated using voice recognition software.  Despite best efforts to proofread,  errors can occur which can change the documentation meaning.         Final Clinical Impression(s) / ED Diagnoses Final diagnoses:  Acute sinusitis, recurrence not specified, unspecified location    Rx / DC Orders ED Discharge Orders          Ordered    amoxicillin -clavulanate (AUGMENTIN ) 875-125 MG tablet  Every 12 hours        10/20/23 0808               Ruthe Cornet, DO 10/20/23 [id number]

## 2024-01-02 DIAGNOSIS — D225 Melanocytic nevi of trunk: Secondary | ICD-10-CM | POA: Diagnosis not present

## 2024-01-02 DIAGNOSIS — L814 Other melanin hyperpigmentation: Secondary | ICD-10-CM | POA: Diagnosis not present

## 2024-01-02 DIAGNOSIS — L821 Other seborrheic keratosis: Secondary | ICD-10-CM | POA: Diagnosis not present

## 2024-01-02 DIAGNOSIS — L578 Other skin changes due to chronic exposure to nonionizing radiation: Secondary | ICD-10-CM | POA: Diagnosis not present

## 2024-01-14 DIAGNOSIS — R350 Frequency of micturition: Secondary | ICD-10-CM | POA: Diagnosis not present

## 2024-07-21 DIAGNOSIS — Z1329 Encounter for screening for other suspected endocrine disorder: Secondary | ICD-10-CM | POA: Diagnosis not present

## 2024-07-21 DIAGNOSIS — Z6826 Body mass index (BMI) 26.0-26.9, adult: Secondary | ICD-10-CM | POA: Diagnosis not present

## 2024-07-21 DIAGNOSIS — Z13228 Encounter for screening for other metabolic disorders: Secondary | ICD-10-CM | POA: Diagnosis not present

## 2024-07-21 DIAGNOSIS — Z1321 Encounter for screening for nutritional disorder: Secondary | ICD-10-CM | POA: Diagnosis not present

## 2024-07-21 DIAGNOSIS — Z131 Encounter for screening for diabetes mellitus: Secondary | ICD-10-CM | POA: Diagnosis not present

## 2024-07-21 DIAGNOSIS — Z13220 Encounter for screening for lipoid disorders: Secondary | ICD-10-CM | POA: Diagnosis not present

## 2024-08-05 DIAGNOSIS — Z0000 Encounter for general adult medical examination without abnormal findings: Secondary | ICD-10-CM | POA: Diagnosis not present

## 2024-08-05 DIAGNOSIS — Z1331 Encounter for screening for depression: Secondary | ICD-10-CM | POA: Diagnosis not present

## 2024-08-05 DIAGNOSIS — J309 Allergic rhinitis, unspecified: Secondary | ICD-10-CM | POA: Diagnosis not present

## 2024-08-05 DIAGNOSIS — R252 Cramp and spasm: Secondary | ICD-10-CM | POA: Diagnosis not present
